# Patient Record
Sex: Female | Born: 1940 | Race: White | Hispanic: No | Smoking: Never smoker
Health system: Southern US, Community
[De-identification: ages and names within clinical notes are randomized; demographics above are authoritative.]

## PROBLEM LIST (undated history)

## (undated) DIAGNOSIS — F419 Anxiety disorder, unspecified: Secondary | ICD-10-CM

## (undated) DIAGNOSIS — G20A1 Parkinson's disease without dyskinesia, without mention of fluctuations: Secondary | ICD-10-CM

## (undated) DIAGNOSIS — E079 Disorder of thyroid, unspecified: Secondary | ICD-10-CM

## (undated) DIAGNOSIS — I4891 Unspecified atrial fibrillation: Secondary | ICD-10-CM

## (undated) DIAGNOSIS — E119 Type 2 diabetes mellitus without complications: Secondary | ICD-10-CM

## (undated) DIAGNOSIS — G20 Parkinson's disease: Secondary | ICD-10-CM

## (undated) DIAGNOSIS — I10 Essential (primary) hypertension: Secondary | ICD-10-CM

## (undated) DIAGNOSIS — D649 Anemia, unspecified: Secondary | ICD-10-CM

## (undated) DIAGNOSIS — H269 Unspecified cataract: Secondary | ICD-10-CM

## (undated) DIAGNOSIS — E785 Hyperlipidemia, unspecified: Secondary | ICD-10-CM

## (undated) HISTORY — DX: Hyperlipidemia, unspecified: E78.5

## (undated) HISTORY — PX: THYROIDECTOMY, PARTIAL: SHX18

## (undated) HISTORY — DX: Anemia, unspecified: D64.9

## (undated) HISTORY — PX: KNEE SURGERY: SHX244

## (undated) HISTORY — DX: Anxiety disorder, unspecified: F41.9

## (undated) HISTORY — DX: Parkinson's disease: G20

## (undated) HISTORY — DX: Type 2 diabetes mellitus without complications: E11.9

## (undated) HISTORY — DX: Essential (primary) hypertension: I10

## (undated) HISTORY — DX: Unspecified cataract: H26.9

## (undated) HISTORY — DX: Disorder of thyroid, unspecified: E07.9

## (undated) HISTORY — DX: Unspecified atrial fibrillation: I48.91

## (undated) HISTORY — PX: FEMUR FRACTURE SURGERY: SHX633

## (undated) HISTORY — PX: APPENDECTOMY: SHX54

## (undated) HISTORY — PX: OTHER SURGICAL HISTORY: SHX169

## (undated) HISTORY — DX: Parkinson's disease without dyskinesia, without mention of fluctuations: G20.A1

---

## 2007-12-06 ENCOUNTER — Ambulatory Visit: Payer: Self-pay | Admitting: Internal Medicine

## 2007-12-20 ENCOUNTER — Ambulatory Visit: Payer: Self-pay | Admitting: Internal Medicine

## 2013-12-06 ENCOUNTER — Other Ambulatory Visit: Payer: Self-pay | Admitting: Nurse Practitioner

## 2014-07-03 DIAGNOSIS — E1142 Type 2 diabetes mellitus with diabetic polyneuropathy: Secondary | ICD-10-CM | POA: Insufficient documentation

## 2014-08-04 DIAGNOSIS — M51369 Other intervertebral disc degeneration, lumbar region without mention of lumbar back pain or lower extremity pain: Secondary | ICD-10-CM | POA: Insufficient documentation

## 2014-08-04 DIAGNOSIS — E119 Type 2 diabetes mellitus without complications: Secondary | ICD-10-CM | POA: Insufficient documentation

## 2014-08-04 DIAGNOSIS — I10 Essential (primary) hypertension: Secondary | ICD-10-CM | POA: Insufficient documentation

## 2014-08-04 DIAGNOSIS — M169 Osteoarthritis of hip, unspecified: Secondary | ICD-10-CM | POA: Insufficient documentation

## 2014-08-04 DIAGNOSIS — M5136 Other intervertebral disc degeneration, lumbar region: Secondary | ICD-10-CM | POA: Insufficient documentation

## 2014-12-17 DIAGNOSIS — G259 Extrapyramidal and movement disorder, unspecified: Secondary | ICD-10-CM | POA: Insufficient documentation

## 2014-12-17 DIAGNOSIS — F325 Major depressive disorder, single episode, in full remission: Secondary | ICD-10-CM | POA: Insufficient documentation

## 2014-12-17 DIAGNOSIS — E039 Hypothyroidism, unspecified: Secondary | ICD-10-CM | POA: Insufficient documentation

## 2014-12-24 DIAGNOSIS — IMO0002 Reserved for concepts with insufficient information to code with codable children: Secondary | ICD-10-CM | POA: Insufficient documentation

## 2015-01-02 ENCOUNTER — Encounter: Payer: Self-pay | Admitting: Hematology & Oncology

## 2015-01-12 ENCOUNTER — Other Ambulatory Visit: Payer: Self-pay | Admitting: Family

## 2015-01-12 DIAGNOSIS — D649 Anemia, unspecified: Secondary | ICD-10-CM

## 2015-01-13 ENCOUNTER — Other Ambulatory Visit (HOSPITAL_BASED_OUTPATIENT_CLINIC_OR_DEPARTMENT_OTHER): Payer: PPO | Admitting: Lab

## 2015-01-13 ENCOUNTER — Ambulatory Visit: Payer: PPO

## 2015-01-13 ENCOUNTER — Ambulatory Visit (HOSPITAL_BASED_OUTPATIENT_CLINIC_OR_DEPARTMENT_OTHER): Payer: PPO | Admitting: Family

## 2015-01-13 ENCOUNTER — Encounter: Payer: Self-pay | Admitting: Family

## 2015-01-13 VITALS — BP 165/70 | HR 59 | Temp 98.1°F | Resp 16 | Ht 68.0 in | Wt 185.0 lb

## 2015-01-13 DIAGNOSIS — D649 Anemia, unspecified: Secondary | ICD-10-CM

## 2015-01-13 DIAGNOSIS — E119 Type 2 diabetes mellitus without complications: Secondary | ICD-10-CM

## 2015-01-13 LAB — CBC WITH DIFFERENTIAL (CANCER CENTER ONLY)
BASO#: 0 10*3/uL (ref 0.0–0.2)
BASO%: 0.5 % (ref 0.0–2.0)
EOS%: 1 % (ref 0.0–7.0)
Eosinophils Absolute: 0 10*3/uL (ref 0.0–0.5)
HCT: 35.6 % (ref 34.8–46.6)
HGB: 11.3 g/dL — ABNORMAL LOW (ref 11.6–15.9)
LYMPH#: 1.2 10*3/uL (ref 0.9–3.3)
LYMPH%: 28 % (ref 14.0–48.0)
MCH: 30.7 pg (ref 26.0–34.0)
MCHC: 31.7 g/dL — ABNORMAL LOW (ref 32.0–36.0)
MCV: 97 fL (ref 81–101)
MONO#: 0.4 10*3/uL (ref 0.1–0.9)
MONO%: 8.5 % (ref 0.0–13.0)
NEUT%: 62 % (ref 39.6–80.0)
NEUTROS ABS: 2.6 10*3/uL (ref 1.5–6.5)
PLATELETS: 158 10*3/uL (ref 145–400)
RBC: 3.68 10*6/uL — AB (ref 3.70–5.32)
RDW: 14.1 % (ref 11.1–15.7)
WBC: 4.1 10*3/uL (ref 3.9–10.0)

## 2015-01-13 LAB — CHCC SATELLITE - SMEAR

## 2015-01-13 NOTE — Progress Notes (Signed)
Hematology/Oncology Consultation   Name: Claudia Finley      MRN: [id number]    Location: Room/bed info not found  Date: 01/13/2015 Time:11:04 AM   REFERRING PHYSICIAN: Synetta Failaniel Jobe  REASON FOR CONSULT: Anemia and thrombocytopenia   DIAGNOSIS:  1. Mild anemia 2. Diabetes mellitus   HISTORY OF PRESENT ILLNESS: Claudia Finley is a very pleasant 74 yo female with a recent history of mild anemia. Her Hgb today is 11.3. She has had no episodes of bleeding.  She denies fever, chills, n/v, cough, rash, dizziness, SOB, chest pain, palpitations, abdominal pain, diarrhea, blood in urine or stool. She has been on Fusion plus vitamins and it has caused her to become constipated. She is going to stop taking this. That is fine. If she needs iron we will give her Claudia Finley.  She has some chronic swelling in her legs that comes and goes. Her legs do not appear to be swollen today. No tenderness, numbness or tingling in her extremities.  Her appetite is good and she is staying hydrated. Her weight is stable.  She has no history of bleeding or clotting disorders or cancer.  Her mother had breast cancer, an aunt with lung cancer and another aunt with brain cancer.  She has 3 children all of whom are in good health. She had 4 miscarriages. She states that this was associated with the fact that she was born with 2 uteruses.  She has had a hysterectomy.  She is retired from [employer].  ROS: All other 10 point review of systems is negative.   PAST MEDICAL HISTORY:   No past medical history on file.  ALLERGIES: Not on File    MEDICATIONS:  No current outpatient prescriptions on file prior to visit.   No current facility-administered medications on file prior to visit.     PAST SURGICAL HISTORY No past surgical history on file.  FAMILY HISTORY: No family history on file.  SOCIAL HISTORY:  has no tobacco, alcohol, and drug history on file.  PERFORMANCE STATUS: The patient's performance  status is 0 - Asymptomatic  PHYSICAL EXAM: Most Recent Vital Signs: There were no vitals taken for this visit. BP 165/70 mmHg  Pulse 59  Temp(Src) 98.1 F (36.7 C) (Oral)  Resp 16  Ht 5\' 8"  (1.727 m)  Wt 185 lb (83.915 kg)  BMI 28.14 kg/m2  General Appearance:    Alert, cooperative, no distress, appears stated age  Head:    Normocephalic, without obvious abnormality, atraumatic  Eyes:    PERRL, conjunctiva/corneas clear, EOM's intact, fundi    benign, both eyes  Ears:    Normal TM's and external ear canals, both ears  Nose:   Nares normal, septum midline, mucosa normal, no drainage    or sinus tenderness        Back:     Symmetric, no curvature, ROM normal, no CVA tenderness  Lungs:     Clear to auscultation bilaterally, respirations unlabored  Chest Wall:    No tenderness or deformity   Heart:    Regular rate and rhythm, S1 and S2 normal, no murmur, rub   or gallop  Breast Exam:    No tenderness, masses, or nipple abnormality  Abdomen:     Soft, non-tender, bowel sounds active all four quadrants,    no masses, no organomegaly        Extremities:   Extremities normal, atraumatic, no cyanosis or edema  Pulses:   2+ and symmetric all extremities  Skin:   Skin color, texture, turgor normal, no rashes or lesions  Lymph nodes:   Cervical, supraclavicular, and axillary nodes normal  Neurologic:   CNII-XII intact, normal strength, sensation and reflexes    throughout   LABORATORY DATA:  Results for orders placed or performed in visit on 01/13/15 (from the past 48 hour(s))  CBC with Differential [facility])     Status: Abnormal   Collection Time: 01/13/15 10:20 AM  Result Value Ref Range   WBC 4.1 3.9 - 10.0 10e3/uL   RBC 3.68 (L) 3.70 - 5.32 10e6/uL   HGB 11.3 (L) 11.6 - 15.9 g/dL   HCT 40.9 81.1 - 91.4 %   MCV 97 81 - 101 fL   MCH 30.7 26.0 - 34.0 pg   MCHC 31.7 (L) 32.0 - 36.0 g/dL   RDW 78.2 95.6 - 21.3 %   Platelets 158 145 - 400 10e3/uL   NEUT# 2.6 1.5 - 6.5  10e3/uL   LYMPH# 1.2 0.9 - 3.3 10e3/uL   MONO# 0.4 0.1 - 0.9 10e3/uL   Eosinophils Absolute 0.0 0.0 - 0.5 10e3/uL   BASO# 0.0 0.0 - 0.2 10e3/uL   NEUT% 62.0 39.6 - 80.0 %   LYMPH% 28.0 14.0 - 48.0 %   MONO% 8.5 0.0 - 13.0 %   EOS% 1.0 0.0 - 7.0 %   BASO% 0.5 0.0 - 2.0 %  [facility] - Smear     Status: None   Collection Time: 01/13/15 10:20 AM  Result Value Ref Range   Smear Result Smear Available       RADIOGRAPHY: No results found.     PATHOLOGY: None   ASSESSMENT/PLAN: Claudia Finley is a very pleasant 74 yo female with a recent history of mild anemia and is diabetic. She is doing well and is asymptomatic at this time.  Her Hgb today is 11.3 and her differential was normal. Her smear was reviewed by Dr. Myna Hidalgo and he saw no abnormality or evidence of malignancy.  We will have her come back in 3 months for labs and follow-up. At that time we will check her iron studies and erythropoietin level. She knows to call here with any questions or concerns and to go to the ED in the event of an emergency. We can certainly see her sooner if need be.  The patient was discussed with Dr. Myna Hidalgo and he is in agreement with the aforementioned.   [place] [facility]

## 2015-03-20 DIAGNOSIS — E785 Hyperlipidemia, unspecified: Secondary | ICD-10-CM | POA: Insufficient documentation

## 2015-03-23 DIAGNOSIS — D72819 Decreased white blood cell count, unspecified: Secondary | ICD-10-CM | POA: Insufficient documentation

## 2015-04-07 DIAGNOSIS — K635 Polyp of colon: Secondary | ICD-10-CM | POA: Insufficient documentation

## 2015-04-13 ENCOUNTER — Ambulatory Visit (HOSPITAL_BASED_OUTPATIENT_CLINIC_OR_DEPARTMENT_OTHER): Payer: PPO | Admitting: Family

## 2015-04-13 ENCOUNTER — Encounter: Payer: Self-pay | Admitting: Family

## 2015-04-13 ENCOUNTER — Other Ambulatory Visit (HOSPITAL_BASED_OUTPATIENT_CLINIC_OR_DEPARTMENT_OTHER): Payer: PPO

## 2015-04-13 VITALS — BP 117/53 | HR 91 | Temp 97.5°F | Resp 14 | Ht 68.0 in | Wt 182.0 lb

## 2015-04-13 DIAGNOSIS — D649 Anemia, unspecified: Secondary | ICD-10-CM

## 2015-04-13 DIAGNOSIS — E119 Type 2 diabetes mellitus without complications: Secondary | ICD-10-CM | POA: Diagnosis not present

## 2015-04-13 DIAGNOSIS — D696 Thrombocytopenia, unspecified: Secondary | ICD-10-CM

## 2015-04-13 DIAGNOSIS — R05 Cough: Secondary | ICD-10-CM

## 2015-04-13 LAB — CBC WITH DIFFERENTIAL (CANCER CENTER ONLY)
BASO#: 0 10e3/uL (ref 0.0–0.2)
BASO%: 0.5 % (ref 0.0–2.0)
EOS%: 0.8 % (ref 0.0–7.0)
Eosinophils Absolute: 0 10e3/uL (ref 0.0–0.5)
HCT: 35.8 % (ref 34.8–46.6)
HGB: 12 g/dL (ref 11.6–15.9)
LYMPH#: 1.7 10e3/uL (ref 0.9–3.3)
LYMPH%: 44.1 % (ref 14.0–48.0)
MCH: 31.6 pg (ref 26.0–34.0)
MCHC: 33.5 g/dL (ref 32.0–36.0)
MCV: 94 fL (ref 81–101)
MONO#: 0.5 10e3/uL (ref 0.1–0.9)
MONO%: 12.8 % (ref 0.0–13.0)
NEUT#: 1.6 10e3/uL (ref 1.5–6.5)
NEUT%: 41.8 % (ref 39.6–80.0)
Platelets: 169 10e3/uL (ref 145–400)
RBC: 3.8 10e6/uL (ref 3.70–5.32)
RDW: 13.8 % (ref 11.1–15.7)
WBC: 3.8 10e3/uL — ABNORMAL LOW (ref 3.9–10.0)

## 2015-04-13 LAB — IRON AND TIBC CHCC
%SAT: 15 % — AB (ref 21–57)
Iron: 49 ug/dL (ref 41–142)
TIBC: 325 ug/dL (ref 236–444)
UIBC: 276 ug/dL (ref 120–384)

## 2015-04-13 LAB — FERRITIN CHCC: FERRITIN: 25 ng/mL (ref 9–269)

## 2015-04-13 LAB — CHCC SATELLITE - SMEAR

## 2015-04-13 NOTE — Progress Notes (Signed)
Hematology and Oncology Follow Up Visit  Claudia Finley [id number] [birth year] 74 y.o. 04/13/2015   Principle Diagnosis:  Anemia unspecified   Current Therapy:   Observation    Interim History:  Claudia Finley is here today with her husband for a follow-up. She is just getting over a bout with bronchitis. She has a cough that is productive. She says she cough up some phlegm once a day now that it is clearing up. Her lungs sound clear.  She is mildly SOB at times with exertion.  He Hgb today is up to 12.0 MCV 94 and platelet count is 169.  She has had no bruising or episodes of bleeding. We did check iron studies and an erythropoietin level on her today. These are pending.  She denies fatigue, fever, chills, n/v, rash dizziness, chest pain, palpitations, abdominal pain, diarrhea, blood in urine or stool. No lymphadenopathy on exam. She has some constipation which is relieved with prune juice.  No swelling, tenderness, numbness or tingling in her extremities. No new aches or pains.    Medications:    Medication List       This list is accurate as of: 04/13/15 11:37 AM.  Always use your most recent med list.               atorvastatin 40 MG tablet  Commonly known as:  LIPITOR  Take 40 mg by mouth every morning.     Biotin 5000 MCG Caps  Take by mouth every morning.     diltiazem 180 MG 24 hr capsule  Commonly known as:  DILACOR XR  Take 180 mg by mouth daily.     docusate sodium 100 MG capsule  Commonly known as:  COLACE  Take 100 mg by mouth 2 (two) times daily.     escitalopram 10 MG tablet  Commonly known as:  LEXAPRO  Take 10 mg by mouth daily.     Fish Oil 1000 MG Caps  Take by mouth every morning.     FUSION PLUS PO  Take by mouth every morning.     glucose blood test strip  Use as directed for blood sugar maintenance.     l-methylfolate-B6-B12 3-35-2 MG Tabs  Commonly known as:  METANX  Take 1 tablet by mouth daily.     levothyroxine 88 MCG tablet    Commonly known as:  SYNTHROID, LEVOTHROID  Take 88 mcg by mouth daily before breakfast.     metFORMIN 500 MG 24 hr tablet  Commonly known as:  GLUCOPHAGE-XR  Take 2 mg by mouth 2 (two) times daily. 1000 mg bid     quinapril 20 MG tablet  Commonly known as:  ACCUPRIL  Take 10 mg by mouth daily. TAKES 1/2 OF 10 MG.= 5 MG     rOPINIRole 1 MG tablet  Commonly known as:  REQUIP  Two in the AM , one at lunch and two in the late afternoon.        Allergies: Not on File  Past Medical History, Surgical history, Social history, and Family History were reviewed and updated.  Review of Systems: All other 10 point review of systems is negative.   Physical Exam:  vitals were not taken for this visit.  Wt Readings from Last 3 Encounters:  01/13/15 185 lb (83.915 kg)    Ocular: Sclerae unicteric, pupils equal, round and reactive to light Ear-nose-throat: Oropharynx clear, dentition fair Lymphatic: No cervical or supraclavicular adenopathy Lungs no rales or rhonchi, good excursion  bilaterally Heart regular rate and rhythm, no murmur appreciated Abd soft, nontender, positive bowel sounds MSK no focal spinal tenderness, no joint edema Neuro: non-focal, well-oriented, appropriate affect Breasts: Deferred  Lab Results  Component Value Date   WBC 3.8* 04/13/2015   HGB 12.0 04/13/2015   HCT 35.8 04/13/2015   MCV 94 04/13/2015   PLT 169 04/13/2015   No results found for: FERRITIN, IRON, TIBC, UIBC, IRONPCTSAT Lab Results  Component Value Date   RBC 3.80 04/13/2015   No results found for: KPAFRELGTCHN, LAMBDASER, KAPLAMBRATIO No results found for: IGGSERUM, IGA, IGMSERUM No results found for: TOTALPROTELP, ALBUMINELP, A1GS, A2GS, BETS, BETA2SER, GAMS, MSPIKE, SPEI   Chemistry   No results found for: NA, K, CL, CO2, BUN, CREATININE, GLU No results found for: CALCIUM, ALKPHOS, AST, ALT, BILITOT   Impression and Plan: Claudia Finley is a very pleasant 74 yo female with a recent  history of mild anemia and who is also diabetic. She is feeling better after having bronchitis this past week.  Her anemia and thrombocytopenia have improved without us having to intervene. We will continue to follow along and monitor.    Her Hgb is up to 12.0 and platelet count is 165. We will see what her iron and erythropoietin level shows.  We will plan to see her back in 6 months for labs and follow-up.  She knows to call here with any questions or concerns and to go to the ED in the event of an emergency. We can certainly see her sooner if need be.   Verdie MosherINCINNATI,SARAH M, NP 5/16/201611:37 AM

## 2015-04-15 ENCOUNTER — Telehealth: Payer: Self-pay | Admitting: Hematology & Oncology

## 2015-04-15 ENCOUNTER — Other Ambulatory Visit: Payer: Self-pay | Admitting: Family

## 2015-04-15 DIAGNOSIS — D649 Anemia, unspecified: Secondary | ICD-10-CM

## 2015-04-15 LAB — RETICULOCYTES (CHCC)
ABS Retic: 54.5 10*3/uL (ref 19.0–186.0)
RBC.: 3.89 MIL/uL (ref 3.87–5.11)
Retic Ct Pct: 1.4 % (ref 0.4–2.3)

## 2015-04-15 LAB — ERYTHROPOIETIN: Erythropoietin: 26.9 m[IU]/mL — ABNORMAL HIGH (ref 2.6–18.5)

## 2015-04-15 NOTE — Telephone Encounter (Signed)
Per Md order to sch iron apt.  Apt was sch and patient was called and given apt date/time.  Patient is aware of apt

## 2015-04-21 ENCOUNTER — Ambulatory Visit (HOSPITAL_BASED_OUTPATIENT_CLINIC_OR_DEPARTMENT_OTHER): Payer: PPO

## 2015-04-21 VITALS — BP 148/60 | HR 63 | Temp 97.8°F | Resp 20

## 2015-04-21 DIAGNOSIS — D509 Iron deficiency anemia, unspecified: Secondary | ICD-10-CM

## 2015-04-21 DIAGNOSIS — E119 Type 2 diabetes mellitus without complications: Secondary | ICD-10-CM

## 2015-04-21 DIAGNOSIS — D649 Anemia, unspecified: Secondary | ICD-10-CM

## 2015-04-21 MED ORDER — SODIUM CHLORIDE 0.9 % IV SOLN
510.0000 mg | Freq: Once | INTRAVENOUS | Status: AC
  Administered 2015-04-21: 510 mg via INTRAVENOUS
  Filled 2015-04-21: qty 17

## 2015-04-21 MED ORDER — SODIUM CHLORIDE 0.9 % IV SOLN
Freq: Once | INTRAVENOUS | Status: AC
  Administered 2015-04-21: 12:00:00 via INTRAVENOUS

## 2015-04-21 NOTE — Patient Instructions (Signed)

## 2015-06-02 ENCOUNTER — Encounter: Payer: Self-pay | Admitting: Family

## 2015-06-02 ENCOUNTER — Other Ambulatory Visit (HOSPITAL_BASED_OUTPATIENT_CLINIC_OR_DEPARTMENT_OTHER): Payer: PPO

## 2015-06-02 ENCOUNTER — Ambulatory Visit (HOSPITAL_BASED_OUTPATIENT_CLINIC_OR_DEPARTMENT_OTHER): Payer: PPO | Admitting: Family

## 2015-06-02 VITALS — BP 120/68 | HR 83 | Temp 97.9°F | Resp 16 | Ht 68.0 in | Wt 182.0 lb

## 2015-06-02 DIAGNOSIS — R5383 Other fatigue: Secondary | ICD-10-CM | POA: Diagnosis not present

## 2015-06-02 DIAGNOSIS — D509 Iron deficiency anemia, unspecified: Secondary | ICD-10-CM

## 2015-06-02 DIAGNOSIS — E119 Type 2 diabetes mellitus without complications: Secondary | ICD-10-CM | POA: Diagnosis not present

## 2015-06-02 DIAGNOSIS — D649 Anemia, unspecified: Secondary | ICD-10-CM

## 2015-06-02 LAB — CBC WITH DIFFERENTIAL (CANCER CENTER ONLY)
BASO#: 0 10*3/uL (ref 0.0–0.2)
BASO%: 0.3 % (ref 0.0–2.0)
EOS%: 0.8 % (ref 0.0–7.0)
Eosinophils Absolute: 0 10*3/uL (ref 0.0–0.5)
HEMATOCRIT: 37.6 % (ref 34.8–46.6)
HEMOGLOBIN: 12.5 g/dL (ref 11.6–15.9)
LYMPH#: 1.5 10*3/uL (ref 0.9–3.3)
LYMPH%: 41.9 % (ref 14.0–48.0)
MCH: 33 pg (ref 26.0–34.0)
MCHC: 33.2 g/dL (ref 32.0–36.0)
MCV: 99 fL (ref 81–101)
MONO#: 0.3 10*3/uL (ref 0.1–0.9)
MONO%: 7.8 % (ref 0.0–13.0)
NEUT#: 1.8 10*3/uL (ref 1.5–6.5)
NEUT%: 49.2 % (ref 39.6–80.0)
Platelets: 138 10*3/uL — ABNORMAL LOW (ref 145–400)
RBC: 3.79 10*6/uL (ref 3.70–5.32)
RDW: 15.5 % (ref 11.1–15.7)
WBC: 3.6 10*3/uL — ABNORMAL LOW (ref 3.9–10.0)

## 2015-06-02 LAB — FERRITIN CHCC: Ferritin: 83 ng/ml (ref 9–269)

## 2015-06-02 LAB — IRON AND TIBC CHCC
%SAT: 24 % (ref 21–57)
IRON: 67 ug/dL (ref 41–142)
TIBC: 278 ug/dL (ref 236–444)
UIBC: 211 ug/dL (ref 120–384)

## 2015-06-02 NOTE — Progress Notes (Signed)
Hematology and Oncology Follow Up Visit  Claudia Finley [id number] 1941-09-18 74 y.o. 06/02/2015   Principle Diagnosis:  Anemia unspecified   Current Therapy:   IV iron as indicated - last dose 04/21/15    Interim History:  Claudia Finley is here today with her husband for a follow-up. She is feeling better but still has some fatigue at times. She was having an issues with bursitis in her left hip and received a steroid injection. This along with a medrol dose pack has helped relieve her discomfort.  She states that her blood sugars have been controlled and are staying in the mid to high 90's consistently. She has had no bruising or episodes of bleeding.  She denies fever, chills, n/v, rash dizziness, chest pain, palpitations, abdominal pain, diarrhea, blood in urine or stool. She is still having constipation. I advised her to start taking Miralax BID until she has a BM and then once daily. She is going to try this.  No swelling, tenderness, numbness or tingling in her extremities. No new aches or pains.  She has a good appetite and is eating healthy. She admits that she should be drinking more fluids. Her weight is stable.   Medications:    Medication List       This list is accurate as of: 06/02/15 12:39 PM.  Always use your most recent med list.               acetaminophen 500 MG tablet  Commonly known as:  TYLENOL  Take 500 mg by mouth every 6 (six) hours as needed.     atorvastatin 40 MG tablet  Commonly known as:  LIPITOR  Take 40 mg by mouth every morning.     Biotin 5000 MCG Caps  Take by mouth every morning.     diltiazem 180 MG 24 hr capsule  Commonly known as:  DILACOR XR  Take 180 mg by mouth daily.     diphenhydramine-acetaminophen 25-500 MG Tabs  Commonly known as:  TYLENOL PM  Take 1 tablet by mouth at bedtime as needed.     docusate sodium 100 MG capsule  Commonly known as:  COLACE  Take 100 mg by mouth daily.     Fish Oil 1000 MG Caps  Take by mouth  every morning.     gabapentin 100 MG capsule  Commonly known as:  NEURONTIN  1 tab po QHS PRN     glucose blood test strip  Use as directed for blood sugar maintenance.     levothyroxine 88 MCG tablet  Commonly known as:  SYNTHROID, LEVOTHROID  Take 88 mcg by mouth daily before breakfast.     metFORMIN 500 MG 24 hr tablet  Commonly known as:  GLUCOPHAGE-XR  Take 2 mg by mouth 2 (two) times daily. 1000 mg bid     quinapril 20 MG tablet  Commonly known as:  ACCUPRIL  Take 10 mg by mouth daily. TAKES 1/2 OF 10 MG.= 5 MG     rOPINIRole 1 MG tablet  Commonly known as:  REQUIP  Two in the AM , one at lunch and two in the late afternoon.        Allergies:  Allergies  Allergen Reactions  . Escitalopram     Hallucinations    Past Medical History, Surgical history, Social history, and Family History were reviewed and updated.  Review of Systems: All other 10 point review of systems is negative.   Physical Exam:  height is   (1.727 m) and weight is 182 lb (82.555 kg). Her oral temperature is 97.9 F (36.6 C). Her blood pressure is 120/68 and her pulse is 83. Her respiration is 16.   Wt Readings from Last 3 Encounters:  06/02/15 182 lb (82.555 kg)  04/13/15 182 lb (82.555 kg)  01/13/15 185 lb (83.915 kg)    Ocular: Sclerae unicteric, pupils equal, round and reactive to light Ear-nose-throat: Oropharynx clear, dentition fair Lymphatic: No cervical or supraclavicular adenopathy Lungs no rales or rhonchi, good excursion bilaterally Heart regular rate and rhythm, no murmur appreciated Abd soft, nontender, positive bowel sounds MSK no focal spinal tenderness, no joint edema Neuro: non-focal, well-oriented, appropriate affect Breasts: Deferred  Lab Results  Component Value Date   WBC 3.6* 06/02/2015   HGB 12.5 06/02/2015   HCT 37.6 06/02/2015   MCV 99 06/02/2015   PLT 138* 06/02/2015   Lab Results  Component Value Date   FERRITIN 25 04/13/2015   IRON 49  04/13/2015   TIBC 325 04/13/2015   UIBC 276 04/13/2015   IRONPCTSAT 15* 04/13/2015   Lab Results  Component Value Date   RETICCTPCT 1.4 04/13/2015   RBC 3.79 06/02/2015   RETICCTABS 54.5 04/13/2015   No results found for: KPAFRELGTCHN, LAMBDASER, KAPLAMBRATIO No results found for: IGGSERUM, IGA, IGMSERUM No results found for: TOTALPROTELP, ALBUMINELP, A1GS, A2GS, BETS, BETA2SER, GAMS, MSPIKE, SPEI   Chemistry   No results found for: NA, K, CL, CO2, BUN, CREATININE, GLU No results found for: CALCIUM, ALKPHOS, AST, ALT, BILITOT   Impression and Plan: Claudia Finley is a very pleasant 74 yo female with a recent history of mild anemia and diabetes. Her blood sugars are controlled and her CBC today looks good. She has had some mild fatigue.  We will see what her iron studies show and bring her in later this week for Feraheme if needed.  We will plan to see her back in 6 weeks for labs and follow-up.  She knows to call here with any questions or concerns and to go to the ED in the event of an emergency. We can certainly see her sooner if need be.   Verdie Mosher, NP 7/5/201612:39 PM

## 2015-06-22 DIAGNOSIS — G20 Parkinson's disease: Secondary | ICD-10-CM | POA: Insufficient documentation

## 2015-07-22 ENCOUNTER — Encounter: Payer: Self-pay | Admitting: Hematology & Oncology

## 2015-07-22 ENCOUNTER — Other Ambulatory Visit (HOSPITAL_BASED_OUTPATIENT_CLINIC_OR_DEPARTMENT_OTHER): Payer: PPO

## 2015-07-22 ENCOUNTER — Ambulatory Visit (HOSPITAL_BASED_OUTPATIENT_CLINIC_OR_DEPARTMENT_OTHER): Payer: PPO | Admitting: Hematology & Oncology

## 2015-07-22 VITALS — BP 141/64 | HR 77 | Temp 97.8°F | Resp 16 | Ht 68.0 in | Wt 195.0 lb

## 2015-07-22 DIAGNOSIS — G20 Parkinson's disease: Secondary | ICD-10-CM | POA: Diagnosis not present

## 2015-07-22 DIAGNOSIS — D530 Protein deficiency anemia: Secondary | ICD-10-CM

## 2015-07-22 DIAGNOSIS — D520 Dietary folate deficiency anemia: Secondary | ICD-10-CM

## 2015-07-22 DIAGNOSIS — D509 Iron deficiency anemia, unspecified: Secondary | ICD-10-CM

## 2015-07-22 LAB — CBC WITH DIFFERENTIAL (CANCER CENTER ONLY)
BASO#: 0 10*3/uL (ref 0.0–0.2)
BASO%: 0.6 % (ref 0.0–2.0)
EOS%: 0.9 % (ref 0.0–7.0)
Eosinophils Absolute: 0 10*3/uL (ref 0.0–0.5)
HCT: 34.8 % (ref 34.8–46.6)
HGB: 11.4 g/dL — ABNORMAL LOW (ref 11.6–15.9)
LYMPH#: 0.9 10*3/uL (ref 0.9–3.3)
LYMPH%: 25 % (ref 14.0–48.0)
MCH: 33 pg (ref 26.0–34.0)
MCHC: 32.8 g/dL (ref 32.0–36.0)
MCV: 101 fL (ref 81–101)
MONO#: 0.4 10*3/uL (ref 0.1–0.9)
MONO%: 10.1 % (ref 0.0–13.0)
NEUT%: 63.4 % (ref 39.6–80.0)
NEUTROS ABS: 2.2 10*3/uL (ref 1.5–6.5)
Platelets: 144 10*3/uL — ABNORMAL LOW (ref 145–400)
RBC: 3.45 10*6/uL — AB (ref 3.70–5.32)
RDW: 14.4 % (ref 11.1–15.7)
WBC: 3.5 10*3/uL — ABNORMAL LOW (ref 3.9–10.0)

## 2015-07-22 LAB — IRON AND TIBC CHCC
%SAT: 26 % (ref 21–57)
IRON: 76 ug/dL (ref 41–142)
TIBC: 291 ug/dL (ref 236–444)
UIBC: 215 ug/dL (ref 120–384)

## 2015-07-22 LAB — FERRITIN CHCC: Ferritin: 44 ng/ml (ref 9–269)

## 2015-07-22 NOTE — Progress Notes (Signed)
Hematology and Oncology Follow Up Visit  Claudia Finley [id number] [birth year] 74 y.o. 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 Last him back in July. She has gotten iron before. She received iron back in May.  On saw her in July, her iron studies showed a ferritin of 83 with iron saturation of 24%.  Her erythropoietin level is also low.  In May when we first saw her, her erythropoietin level was 27. We have not had to give her any Aranesp because her hemoglobin has not been below 11.  She and her husband just got back  from vacation[place]  She feels tired. I think a lot of it is from all of her medications. She does have some cardiac irregularities.  She's had no obvious bleeding.  She has had no change in bowel or bladder habits.  She's had some leg swelling. She has some fluid retention. She goes back to see her family doctor in a couple months  Overall, her performance status is ECOG 2.  She does have Parkinson's disease. It does make it difficult for her to get around..  Medications:  Current outpatient prescriptions:  .  acetaminophen (TYLENOL) 500 MG tablet, Take 500 mg by mouth every 6 (six) hours as needed., Disp: , Rfl:  .  atorvastatin (LIPITOR) 40 MG tablet, Take 40 mg by mouth every morning. , Disp: , Rfl:  .  Biotin 5000 MCG CAPS, Take by mouth every morning., Disp: , Rfl:  .  carbidopa-levodopa (SINEMET IR) 25-100 MG per tablet, Take by mouth., Disp: , Rfl:  .  diclofenac (FLECTOR) 1.3 % PTCH, Apply topically., Disp: , Rfl:  .  diltiazem (DILACOR XR) 180 MG 24 hr capsule, Take 180 mg by mouth daily. , Disp: , Rfl:  .  diphenhydramine-acetaminophen (TYLENOL PM) 25-500 MG TABS, Take 1 tablet by mouth at bedtime as needed., Disp: , Rfl:  .   docusate sodium (COLACE) 100 MG capsule, Take 100 mg by mouth daily., Disp: , Rfl:  .  doxycycline (VIBRA-TABS) 100 MG tablet, Take 100 mg by mouth., Disp: , Rfl:  .  gabapentin (NEURONTIN) 100 MG capsule, 1 tab po QHS PRN, Disp: , Rfl:  .  glucose blood test strip, Use as directed for blood sugar maintenance., Disp: , Rfl:  .  meloxicam (MOBIC) 15 MG tablet, Take 15 mg by mouth., Disp: , Rfl:  .  metFORMIN (GLUCOPHAGE-XR) 500 MG 24 hr tablet, Take 2 mg by mouth 2 (two) times daily. 1000 mg bid, Disp: , Rfl:  .  Omega-3 Fatty Acids (FISH OIL) 1000 MG CAPS, Take by mouth every morning., Disp: , Rfl:  .  polyethylene glycol (MIRALAX / GLYCOLAX) packet, Take by mouth., Disp: , Rfl:  .  quinapril (ACCUPRIL) 20 MG tablet, Take 10 mg by mouth daily. TAKES 1/2 OF 10 MG.= 5 MG, Disp: , Rfl:  .  rOPINIRole (REQUIP) 1 MG tablet, Two in the AM , one at lunch and two in the late afternoon., Disp: , Rfl:  .  levothyroxine (SYNTHROID, LEVOTHROID) 88 MCG tablet, Take 88 mcg by mouth daily before breakfast. , Disp: , Rfl:   Allergies:  Allergies  Allergen Reactions  . Escitalopram     Hallucinations    Past Medical History,  Surgical history, Social history, and Family History were reviewed and updated.  Review of Systems: As above  Physical Exam:  height is  (1.727 m) and weight is 195 lb (88.451 kg). Her oral temperature is 97.8 F (36.6 C). Her blood pressure is 141/64 and her pulse is 77. Her respiration is 16.   Wt Readings from Last 3 Encounters:  07/22/15 195 lb (88.451 kg)  06/02/15 182 lb (82.555 kg)  04/13/15 182 lb (82.555 kg)     Elderly white female in no obvious distress. Head and neck exam shows no ocular or oral lesions. She has no palpable cervical or supraclavicular lymph nodes. Lungs are clear bilaterally. Cardiac exam regular rate and rhythm. She has a 1/6 systolic ejection murmur. Abdomen is soft. She is slightly obese. She has good bowel sounds. There is no palpable liver  or spleen tip. Back exam shows some slight kyphosis. Extremities shows 1 hiving 2+ edema in her lower legs. She has decent range of motion of her joints. Skin exam shows some scattered ecchymoses. Neurological exam shows the Parkinson's facies. Her gait is slightly wide-based.  Lab Results  Component Value Date   WBC 3.5* 07/22/2015   HGB 11.4* 07/22/2015   HCT 34.8 07/22/2015   MCV 101 07/22/2015   PLT 144* 07/22/2015     Chemistry   No results found for: NA, K, CL, CO2, BUN, CREATININE, GLU No results found for: CALCIUM, ALKPHOS, AST, ALT, BILITOT       Impression and Plan: Claudia Finley is 74 year old white female. She has multifactorial anemia. Her anemia is mostly from low iron and probably from low erythropoietin level. She is on quite a few medications. I'm sure that anemia of chronic disease is a possibility given that she has Parkinson's.  We will have see with the iron levels show. I don't think she would qualify for Aranesp.  I want to see her back in another 6 weeks. Hopefully, if her hemoglobin is better at that time, we can move her appointments out every 3 months.   Josph Macho, MD 8/24/201610:59 AM

## 2015-10-14 ENCOUNTER — Other Ambulatory Visit (HOSPITAL_BASED_OUTPATIENT_CLINIC_OR_DEPARTMENT_OTHER): Payer: PPO

## 2015-10-14 ENCOUNTER — Encounter: Payer: Self-pay | Admitting: Family

## 2015-10-14 ENCOUNTER — Ambulatory Visit (HOSPITAL_BASED_OUTPATIENT_CLINIC_OR_DEPARTMENT_OTHER): Payer: PPO | Admitting: Family

## 2015-10-14 VITALS — BP 157/83 | HR 70 | Temp 97.3°F | Wt 196.4 lb

## 2015-10-14 DIAGNOSIS — D649 Anemia, unspecified: Secondary | ICD-10-CM

## 2015-10-14 DIAGNOSIS — G20 Parkinson's disease: Secondary | ICD-10-CM | POA: Diagnosis not present

## 2015-10-14 DIAGNOSIS — D551 Anemia due to other disorders of glutathione metabolism: Secondary | ICD-10-CM

## 2015-10-14 DIAGNOSIS — D520 Dietary folate deficiency anemia: Secondary | ICD-10-CM

## 2015-10-14 LAB — RETICULOCYTES (CHCC)
ABS Retic: 54.6 10*3/uL (ref 19.0–186.0)
RBC.: 3.9 MIL/uL (ref 3.87–5.11)
Retic Ct Pct: 1.4 % (ref 0.4–2.3)

## 2015-10-14 LAB — CBC WITH DIFFERENTIAL (CANCER CENTER ONLY)
BASO#: 0 10*3/uL (ref 0.0–0.2)
BASO%: 0.5 % (ref 0.0–2.0)
EOS ABS: 0.1 10*3/uL (ref 0.0–0.5)
EOS%: 1.2 % (ref 0.0–7.0)
HEMATOCRIT: 37.7 % (ref 34.8–46.6)
HGB: 12.2 g/dL (ref 11.6–15.9)
LYMPH#: 1.2 10*3/uL (ref 0.9–3.3)
LYMPH%: 27.1 % (ref 14.0–48.0)
MCH: 31.3 pg (ref 26.0–34.0)
MCHC: 32.4 g/dL (ref 32.0–36.0)
MCV: 97 fL (ref 81–101)
MONO#: 0.4 10*3/uL (ref 0.1–0.9)
MONO%: 9.5 % (ref 0.0–13.0)
NEUT#: 2.7 10*3/uL (ref 1.5–6.5)
NEUT%: 61.7 % (ref 39.6–80.0)
PLATELETS: 160 10*3/uL (ref 145–400)
RBC: 3.9 10*6/uL (ref 3.70–5.32)
RDW: 13.9 % (ref 11.1–15.7)
WBC: 4.3 10*3/uL (ref 3.9–10.0)

## 2015-10-14 LAB — FERRITIN CHCC: Ferritin: 38 ng/ml (ref 9–269)

## 2015-10-14 LAB — IRON AND TIBC CHCC
%SAT: 20 % — AB (ref 21–57)
IRON: 62 ug/dL (ref 41–142)
TIBC: 311 ug/dL (ref 236–444)
UIBC: 249 ug/dL (ref 120–384)

## 2015-10-14 LAB — CHCC SATELLITE - SMEAR

## 2015-10-14 NOTE — Progress Notes (Signed)
Hematology and Oncology Follow Up Visit  Claudia Finley [id number] 1940-12-05 74 y.o. 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 Claudia Finley is here today for a follow-up. She is doing well.  She had 1 dose of Feraheme in May of this year and responded quite nicely. Her Hgb at this time is 12.2 with an MCV of 97.  She has some mild fatigue at times. She has Parkinson's and states that she has not had any issues with this. She takes her Sinemet daily.  She denies fever, chills, n/v, rash, cough, dizziness, chest pain, palpitations, abdominal pain or changes in bowel or bladder habits.  No swelling, tenderness, numbness or tingling in her extremities.   She is eating well and staying hydrated. Her weight is stable.  She states that her blood sugars are still well controlled on Metformin.   Medications:    Medication List       This list is accurate as of: 10/14/15  9:19 PM.  Always use your most recent med list.               acetaminophen 500 MG tablet  Commonly known as:  TYLENOL  Take 500 mg by mouth every 6 (six) hours as needed.     atorvastatin 40 MG tablet  Commonly known as:  LIPITOR  Take 40 mg by mouth every morning.     Biotin 5000 MCG Caps  Take by mouth every morning.     carbidopa-levodopa 25-100 MG tablet  Commonly known as:  SINEMET IR  Take by mouth.     diltiazem 180 MG 24 hr capsule  Commonly known as:  DILACOR XR  Take 180 mg by mouth daily.     diphenhydramine-acetaminophen 25-500 MG Tabs tablet  Commonly known as:  TYLENOL PM  Take 1 tablet by mouth at bedtime as needed.     gabapentin 100 MG capsule  Commonly known as:  NEURONTIN  1 tab po QHS PRN     gabapentin 100 MG capsule  Commonly known as:  NEURONTIN  Take 100 mg by mouth every morning.     glucose blood test strip    Use as directed for blood sugar maintenance.     levothyroxine 88 MCG tablet  Commonly known as:  SYNTHROID, LEVOTHROID  Take 88 mcg by mouth daily before breakfast.     meloxicam 15 MG tablet  Commonly known as:  MOBIC  Take 15 mg by mouth.     metFORMIN 500 MG 24 hr tablet  Commonly known as:  GLUCOPHAGE-XR  Take 2 mg by mouth 2 (two) times daily. 1000 mg bid     polyethylene glycol packet  Commonly known as:  MIRALAX / GLYCOLAX  Take by mouth.     rOPINIRole 1 MG tablet  Commonly known as:  REQUIP  Two in the AM , one at lunch and two in the late afternoon.     telmisartan 40 MG tablet  Commonly known as:  MICARDIS  Take 40 mg by mouth.        Allergies:  Allergies  Allergen Reactions  . Escitalopram     Hallucinations    Past Medical History, Surgical history, Social history, and Family History were reviewed and updated.  Review of Systems: All other 10 point review of systems is negative.  Physical Exam:  weight is 196 lb 6.4 oz (89.086 kg). Her oral temperature is 97.3 F (36.3 C). Her blood pressure is 157/83 and her pulse is 70.   Wt Readings from Last 3 Encounters:  10/14/15 196 lb 6.4 oz (89.086 kg)  07/22/15 195 lb (88.451 kg)  06/02/15 182 lb (82.555 kg)    Ocular: Sclerae unicteric, pupils equal, round and reactive to light Ear-nose-throat: Oropharynx clear, dentition fair Lymphatic: No cervical or supraclavicular adenopathy Lungs no rales or rhonchi, good excursion bilaterally Heart regular rate and rhythm, no murmur appreciated Abd soft, nontender, positive bowel sounds MSK no focal spinal tenderness, no joint edema Neuro: non-focal, well-oriented, appropriate affect Breasts: Deferred  Lab Results  Component Value Date   WBC 4.3 10/14/2015   HGB 12.2 10/14/2015   HCT 37.7 10/14/2015   MCV 97 10/14/2015   PLT 160 10/14/2015   Lab Results  Component Value Date   FERRITIN 38 10/14/2015   IRON 62 10/14/2015   TIBC 311 10/14/2015    UIBC 249 10/14/2015   IRONPCTSAT 20* 10/14/2015   Lab Results  Component Value Date   RETICCTPCT 1.4 10/14/2015   RBC 3.90 10/14/2015   RETICCTABS 54.6 10/14/2015   No results found for: KPAFRELGTCHN, LAMBDASER, KAPLAMBRATIO No results found for: IGGSERUM, IGA, IGMSERUM No results found for: TOTALPROTELP, ALBUMINELP, A1GS, A2GS, BETS, BETA2SER, GAMS, MSPIKE, SPEI   Chemistry   No results found for: NA, K, CL, CO2, BUN, CREATININE, GLU No results found for: CALCIUM, ALKPHOS, AST, ALT, BILITOT   Impression and Plan: [name] is a very pleasant 74 yo female with a history of mild anemia and erythropoietin deficiency. She has some occasional fatigue which she attributes to her Parkinson's disease.  We will see what her iron studies show and bring her in later this week for Feraheme if needed.  We will plan to see her back in 3 months for labs and follow-up.  She will contact us with any questions or concerns. We can certainly see her sooner if need be.   Verdie Mosher, NP 11/16/20169:19 PM

## 2015-10-15 ENCOUNTER — Other Ambulatory Visit: Payer: Self-pay | Admitting: Nurse Practitioner

## 2015-10-27 ENCOUNTER — Ambulatory Visit: Payer: PPO

## 2015-10-29 ENCOUNTER — Telehealth: Payer: Self-pay | Admitting: Family

## 2015-10-29 NOTE — Telephone Encounter (Signed)
Patient's husband Claudia Finley called and stated that patient fell and broke her leg last week. The patient will not be able to come in for a Iron Infusion.       [facility]

## 2015-10-30 ENCOUNTER — Telehealth: Payer: Self-pay | Admitting: *Deleted

## 2015-10-30 NOTE — Telephone Encounter (Signed)
Vicky RN given orders for lab draw on this patient. They are to draw CBC, Ferritin, Iron TIBC early next week and fax office results.

## 2016-01-14 ENCOUNTER — Ambulatory Visit: Payer: PPO | Admitting: Family

## 2016-01-14 ENCOUNTER — Ambulatory Visit: Payer: PPO

## 2016-01-14 ENCOUNTER — Other Ambulatory Visit: Payer: PPO

## 2016-01-20 ENCOUNTER — Telehealth: Payer: Self-pay | Admitting: Hematology & Oncology

## 2016-01-20 NOTE — Telephone Encounter (Signed)
Betsey Holiday: [id number] Treating Provider: Dr. Arlan Organ Visits: 12 Status: Approved Dates: 01/13/2016 - 07/13/2016      A2130 - Iron      COPY SCANNED

## 2016-03-03 ENCOUNTER — Ambulatory Visit: Payer: PPO

## 2016-03-03 ENCOUNTER — Other Ambulatory Visit: Payer: PPO

## 2016-03-03 ENCOUNTER — Ambulatory Visit: Payer: PPO | Admitting: Family

## 2016-03-07 ENCOUNTER — Other Ambulatory Visit: Payer: Self-pay

## 2016-03-07 DIAGNOSIS — E119 Type 2 diabetes mellitus without complications: Secondary | ICD-10-CM

## 2016-03-07 DIAGNOSIS — Z7984 Long term (current) use of oral hypoglycemic drugs: Principal | ICD-10-CM

## 2016-03-07 NOTE — Patient Outreach (Signed)
[facility]N) Care Management  03/07/2016  Claudia Finley [birth year] [id number]   Telephone Screen  Referral Date: 03/03/16 Referral Source: [facility] Referral Reason: "disease and symptom management"   Outreach attempt #1 to patient. Patient reached. Screening completed.  Social: Patient resides in her home along with her spouse. She is independent with ADLs. Spouse transports her to medical appts. Patient sustained a broken femur in November 2016 secondary to fall and continues to recover from that. She is ambulatory with use of walker. She also reports that she has transport wheelchair and scooter in the home. She denies any further falls.  Conditions: Patient self reported history of DM, anemia and HTN. She reports she checks blood sugars every monring. CBG this am was 99. She is unsure of last A1C level but believes it was around 6. She reports she is due for lab work in the near future. Patient does not monitor BP in the home. However, she voices that it is always "normal" when checked at the MD office.   Medications: Patient denies any issues with affordability or with managing of meds. She reports taking between 10-15 meds. She uses a Medical laboratory scientific officermed planner which she fills herself weekly.    Appointments: She states she saw PCP(Dr. Derrell LollingJobe) a few weeks ago.   Consent: [facility] She voices no issues for care coordination at this time. However, patient is agreeable to [facility] health coach to help further manage and control her diabetes.    Plan: RN CM will notify [facility] of case status. RN CM will send [facility] referral for further diabetes education and management.  Antionette Fairyoshanda Sherrilynn Gudgel, RN,BSN,CCM [facility] Care [facility] Telephonic Care Management Coordinator Direct Phone: [phone] Toll Free: [phone] Fax: [phone]

## 2016-03-18 ENCOUNTER — Other Ambulatory Visit: Payer: Self-pay

## 2016-03-18 NOTE — Patient Outreach (Signed)
[facility](THN) Care Management  03/18/2016  Joellen Jerseyeggy T Delia 28-Jun-1941 [id number]  Telephone call to patient for initial assessment.  No answer.  HIPAA compliant voice message left.    Plan: RN Health Coach will contact patient within 1-2 weeks.  Bary Lericheionne J Nikkita Adeyemi, RN, MSN [facility] Care Management RN Telephonic Health Coach [phone]

## 2016-03-23 ENCOUNTER — Other Ambulatory Visit: Payer: Self-pay

## 2016-03-23 NOTE — Patient Outreach (Signed)
[facility]N) Care Management  [facility]  03/23/2016   Claudia Jerseyeggy T Finley [birth year] [id number]  Subjective: Telephone call to patient for initial assessment.  Patient reports she is doing ok.  She reports a femur fracture in November 2016.  She reports that her recovery has been slow but she is still trying .  Patient reports her last A1c was 6.1.  She reports it has always been in the 6 range.  Discussed A1c and goals for A1c. Also discussed with patient importance of regular exercise to help with diabetes and recovery from her femur fracture. Patient has not had an eye exam within the last year due to fracture but has upcoming appointment.  Patient voices no concerns.  Objective:   Encounter Medications:  Outpatient Encounter Prescriptions as of 03/23/2016  Medication Sig Note  . acetaminophen (TYLENOL) 500 MG tablet Take 500 mg by mouth every 6 (six) hours as needed.   . Biotin 5000 MCG CAPS Take by mouth every morning.   . carbidopa-levodopa (SINEMET IR) 25-100 MG per tablet Take by mouth. 07/22/2015: [facility]  . diltiazem (DILACOR XR) 180 MG 24 hr capsule Take 180 mg by mouth daily.  01/13/2015: [facility]  . diphenhydramine-acetaminophen (TYLENOL PM) 25-500 MG TABS Take 1 tablet by mouth at bedtime as needed.   Marland Kitchen. glucose blood test strip Use as directed for blood sugar maintenance. 01/13/2015: [facility]  . levothyroxine (SYNTHROID, LEVOTHROID) 88 MCG tablet Take 88 mcg by mouth daily before breakfast.  01/13/2015: [facility]  . metFORMIN (GLUCOPHAGE-XR) 500 MG 24 hr tablet Take 2 mg by mouth 2 (two) times daily. 1000 mg bid 01/13/2015: [facility]  . rOPINIRole (REQUIP) 1 MG tablet Two in the AM , one at lunch and two in the late afternoon. 01/13/2015: [facility]  . telmisartan (MICARDIS) 40 MG tablet Take 40 mg by mouth. 10/14/2015: [facility]  . atorvastatin (LIPITOR) 40 MG tablet Take 40 mg by mouth every morning.  01/13/2015: [facility]  . gabapentin (NEURONTIN) 100 MG capsule 1 tab po QHS PRN 06/02/2015: [facility]  . gabapentin (NEURONTIN) 100 MG capsule Take 100 mg by mouth every morning. 10/14/2015: [facility] Received Sig: 1 tab po QHS PRN  . meloxicam (MOBIC) 15 MG tablet Take 15 mg by mouth. Reported on 03/23/2016 07/22/2015: [facility]  . polyethylene glycol (MIRALAX / GLYCOLAX) packet Take by mouth. Reported on 03/23/2016 07/22/2015: [facility]   No facility-administered encounter medications on file as of 03/23/2016.    Functional Status:  In your present state of health, do you have any difficulty performing the following activities: 03/23/2016  Hearing? N  Vision? N  Difficulty concentrating or making decisions? N  Walking or climbing stairs? Y  Dressing or bathing? Y  Doing errands, shopping? Y  Preparing Food and eating ? N  Using the Toilet? N  In the past six months, have you accidently leaked urine? Y  Do you have problems with loss of bowel control? N  Managing your Medications? N  Managing your Finances? N  Housekeeping or managing your Housekeeping? Y    Fall/Depression Screening: PHQ 2/9 Scores 03/23/2016 03/07/2016  PHQ - 2 Score 0 0    Assessment: Patient will benefit from health coach outreach for disease management  and support for diabetes.   Plan:  [facility] CM Care Plan Problem One        Most Recent Value   Care Plan Problem One  Diabetes knowledge deficit   Role Documenting the Problem One  Health Coach   Care Plan for Problem One  Active   THN Long Term Goal (31-90 days)  Patient will maintain A1c less than 6.5 within 90 days.   THN Long Term Goal Start Date  03/23/16   Interventions for Problem One Long Term Goal  RN Health Coach discussed with patient A1c and A1c goals.   THN CM Short  Term Goal #1 (0-30 days)  Patient will be able to report maintaining low carbohydrate diet within 30 days.    THN CM Short Term Goal #1 Start Date  03/23/16   Interventions for Short Term Goal #1  Discussed avoidance of high carbohydrate foods such as potatoes, rice, and pastas.    THN CM Short Term Goal #2 (0-30 days)  Patient will report some type of exercise within 30 days.   THN CM Short Term Goal #2 Start Date  03/23/16   Interventions for Short Term Goal #2  RN Health Coach discussed with patient the importance of exercise.      RN Health Coach will provide ongoing education for patient on diabetes through phone calls and sending printed information to patient for further discussion.  RN Health Coach will send welcome packet with consent to patient as well as printed information on diabetes.  RN Health Coach will send initial barriers letter, assessment, and care plan to primary care physician. RN Health Coach will contact patient within one month and patient agrees to next contact.

## 2016-04-18 ENCOUNTER — Other Ambulatory Visit: Payer: Self-pay

## 2016-04-18 NOTE — Patient Outreach (Signed)
[facility]) Care Management  [facility] Care Manager  04/18/2016   Claudia Finley [birth year] [id number]  Subjective: Telephone call to patient for monthly call.  Patient reports she is doing good.  Patient reports that her blood sugars are running in the 90's with today's blood sugar was 94. Discussed with patient importance of maintaining low carbohydrate diet in order to maintain blood sugars.  She verbalized understanding.  No concerns.   Objective:   Encounter Medications:  Outpatient Encounter Prescriptions as of 04/18/2016  Medication Sig Note  . acetaminophen (TYLENOL) 500 MG tablet Take 500 mg by mouth every 6 (six) hours as needed.   . Biotin 5000 MCG CAPS Take by mouth every morning.   . carbidopa-levodopa (SINEMET IR) 25-100 MG per tablet Take by mouth. 07/22/2015: Received from: [facility]  . diltiazem (DILACOR XR) 180 MG 24 hr capsule Take 180 mg by mouth daily.  01/13/2015: Received from: [facility]  . diphenhydramine-acetaminophen (TYLENOL PM) 25-500 MG TABS Take 1 tablet by mouth at bedtime as needed.   Marland Kitchen glucose blood test strip Use as directed for blood sugar maintenance. 01/13/2015: Received from: [facility]  . levothyroxine (SYNTHROID, LEVOTHROID) 88 MCG tablet Take 88 mcg by mouth daily before breakfast.  01/13/2015: Received from: [facility]  . meloxicam (MOBIC) 15 MG tablet Take 15 mg by mouth. Reported on 03/23/2016 07/22/2015: Received from: [facility]  . metFORMIN (GLUCOPHAGE-XR) 500 MG 24 hr tablet Take 2 mg by mouth 2 (two) times daily. 1000 mg bid 01/13/2015: Received from: [facility]  . polyethylene glycol (MIRALAX / GLYCOLAX) packet Take by mouth. Reported on 03/23/2016 07/22/2015: Received from: [facility]  . rOPINIRole (REQUIP) 1 MG tablet Two in the AM , one at lunch and two in the late afternoon. 01/13/2015: Received from: [facility]  . telmisartan (MICARDIS) 40 MG tablet Take 40 mg by mouth. 10/14/2015: Received  [facility]: [facility]  . atorvastatin (LIPITOR) 40 MG tablet Take 40 mg by mouth every morning.  01/13/2015: Received from: [facility]  . gabapentin (NEURONTIN) 100 MG capsule 1 tab po QHS PRN 06/02/2015: Received from: [facility]  . gabapentin (NEURONTIN) 100 MG capsule Take 100 mg by mouth every morning. 10/14/2015: Re[facility]: [facility] Received Sig: 1 tab po QHS PRN   No facility-administered encounter medications on file as of 04/18/2016.    Functional Status:  In your present state of health, do you have any difficulty performing the following activities: 03/23/2016  Hearing? N  Vision? N  Difficulty concentrating or making decisions? N  Walking or climbing stairs? Y  Dressing or bathing? Y  Doing errands, shopping? Y  Preparing Food and eating ? N  Using the Toilet? N  In the past six months, have you accidently leaked urine? Y  Do you have problems with loss of bowel control? N  Managing your Medications? N  Managing your Finances? N  Housekeeping or managing your Housekeeping? Y    Fall/Depression Screening: PHQ 2/9 Scores 03/23/2016 03/07/2016  PHQ - 2 Score 0 0    Assessment: Patient continues to benefit from health coach outreach for disease management and support.    Plan:  [facility] CM Care Plan Problem One        Most Recent Value   Care Plan Problem One  Diabetes knowledge deficit   Role Documenting the Problem One  Health Coach   Care Plan for Problem  One  Active   THN Long Term Goal (31-90 days)  Patient will maintain A1c less than 6.5 within 90 days.   THN Long Term Goal Start Date  03/23/16 [goal continued]   Interventions for Problem One Long Term Goal  RN Health Coach reviewed with patient A1c and A1c goals.   THN CM Short Term Goal #1 (0-30 days)  Patient will be able to report maintaining low carbohydrate diet within 30 days.    THN CM Short Term Goal #1 Start Date  04/18/16 [goal continued]   Interventions for Short Term Goal #1  RN Health  Coach reviewed avoidance of high carbohydrate foods such as potatoes, rice, and pastas.    THN CM Short Term Goal #2 (0-30 days)  Patient will report some type of exercise within 30 days.   THN CM Short Term Goal #2 Start Date  04/18/16 Staci Righter[goal continued]   Interventions for Short Term Goal #2  RN Health Coach reviewed with patient the importance of exercise.      RN Health Coach will contact patient within one month and patient agrees to next outreach.    Bary Lericheionne J Raymund Manrique, RN, MSN [facility] Care Management RN Telephonic Health Coach [phone]

## 2016-05-13 ENCOUNTER — Other Ambulatory Visit: Payer: Self-pay

## 2016-05-13 NOTE — Patient Outreach (Signed)
[facility]) Care Management  05/13/2016  Joellen Jerseyeggy T Depascale [birth year] [id number]   Telephone call to patient for monthly call.  No answer.  HIPAA compliant voice message left.    Plan: RN Health Coach will attempt patient within 2 weeks.     Bary Lericheionne J Lilee Aldea, RN, MSN [facility] Care Management RN Telephonic Health Coach [phone]

## 2016-05-20 ENCOUNTER — Other Ambulatory Visit: Payer: Self-pay

## 2016-05-20 NOTE — Patient Outreach (Signed)
[facility]) Care Management  05/20/2016  Claudia Finley 07-20-1941 [id number]   Return telephone call to patient. Patient reports after reviewing information and thinking things over she has decided to opt out of the program. Advised patient on benefit of the program. Patient declined.    Plan: RN Health Coach will notify physician and care management assistant of case closure. RN Health Coach will send patient closure letter.   Bary Lericheionne J Melecio Cueto, RN, MSN [facility] Care Management RN Telephonic Health Coach [phone]

## 2016-05-24 ENCOUNTER — Ambulatory Visit: Payer: Self-pay

## 2018-01-24 ENCOUNTER — Encounter: Payer: Self-pay | Admitting: Internal Medicine

## 2018-03-13 ENCOUNTER — Encounter: Payer: Self-pay | Admitting: Neurology

## 2018-03-13 ENCOUNTER — Ambulatory Visit: Payer: PPO | Admitting: Neurology

## 2018-03-13 VITALS — BP 129/73 | HR 79 | Ht 69.0 in | Wt 207.0 lb

## 2018-03-13 DIAGNOSIS — R269 Unspecified abnormalities of gait and mobility: Secondary | ICD-10-CM | POA: Insufficient documentation

## 2018-03-13 DIAGNOSIS — G20 Parkinson's disease: Secondary | ICD-10-CM | POA: Diagnosis not present

## 2018-03-13 NOTE — Patient Instructions (Addendum)
Stop Daily ASA 81mg  daiy.  Stop Requip= Ropinirole 1mg  2 tabs three times a day   In house rehab

## 2018-03-13 NOTE — Progress Notes (Signed)
PATIENT: Claudia Finley DOB: 1941/09/25  Chief Complaint  Patient presents with  . Parkinson's Disease    She is here with her daughter, [name], to establish new care for her Parkinson's Disease.  She uses a rolling walker to help with walking. Her memory is worse. She has previously been treated by Dr. [name] in [facility].  Marland Kitchen PCP     HISTORICAL  CLARIS Finley is a 77 year old female, seen in refer by her primary care doctor [name] to follow-up for Parkinson's disease, she is accompanied by her daughter [name] at today's visit, initial evaluation was on March 13, 2018,  I reviewed her previous neurologist Dr. Rondel Baton note, most recent visit was on May 03, 2017, was under the care of Dr. [name] since February 2015.  She has past medical history of hypertension, hyperlipidemia, chronic atrial fibrillation, on chronic Xarelto treatment, denied previous history of coronary artery disease or stroke, somehow she is also on aspirin 81 mg daily, complains of frequent bruise, also recent worsening bilateral lower extremity edema.  She was seen by Dr. [name] since 2015 because of gradual onset bilateral hands tremor, diffuse weakness, gait abnormality, decreased facial expression per record, micrographia, she was diagnosed with Parkinson's disease, initially was treated with Requip 0.25 mg gradually titrating up to current 2 mg 3 times a day, Sinemet was introduced around 2017, now taking 1 tablet 4 tabs daily  She fell, suffered a right femur fracture in 2017, has relied on her walker since.  She moved in assistant living [place] since December 2018, with her husband, unfortunately, her husband passed away in 01-25-19she went through a lot of changes recently,  She now complains of diffuse body achy pain, increased right shoulder pain, had a history of right rotator cuff syndrome, she also complains of diffuse joint pain, especially right knee, slow moving, she  can still ambulate with her walker to the dining hall, along the hallway, dressing, but has worsening urinary incontinence, wearing depends, require help with bathing.   She was also noted to have slow worsening memory loss,  MRI was normal in February 2015,  Echocardiogram in March 2019 showed normal left ventricular site systolic function was no appreciable segmental abnormality, ejection fraction was 60%, moderate tricuspid regurgitation,  Laboratory evaluation March 2019 showed normal CMP creatinine of 0.65, hemoglobin of 10.2, LDL was 52  REVIEW OF SYSTEMS: Full 14 system review of systems performed and notable only for as above  ALLERGIES: Allergies  Allergen Reactions  . Benztropine     Other reaction(s): Hallucinations Stomach cramps Stomach cramps   . Escitalopram     Hallucinations    HOME MEDICATIONS: Current Outpatient Medications  Medication Sig Dispense Refill  . acetaminophen (TYLENOL) 500 MG tablet Take 500 mg by mouth every 6 (six) hours as needed.    Marland Kitchen aspirin 81 MG tablet Take 81 mg by mouth daily.    Marland Kitchen atorvastatin (LIPITOR) 40 MG tablet Take 40 mg by mouth daily.    . Biotin 5000 MCG CAPS Take by mouth every morning.    . carbidopa-levodopa (SINEMET IR) 25-100 MG per tablet Take 1 tablet by mouth 4 (four) times daily.     . Cholecalciferol (VITAMIN D3) 2000 units TABS Take 2,000 Units by mouth daily.    . Cyanocobalamin (VITAMIN B-12 IJ) Inject 1,000 Units as directed every 30 (thirty) days.    Marland Kitchen diltiazem (DILACOR XR) 180 MG 24 hr capsule Take 180 mg  by mouth daily.     . diphenhydramine-acetaminophen (TYLENOL PM) 25-500 MG TABS Take 1 tablet by mouth at bedtime as needed.    . docusate sodium (COLACE) 100 MG capsule Take 100 mg by mouth daily as needed for mild constipation.    Marland Kitchen escitalopram (LEXAPRO) 10 MG tablet Take 10 mg by mouth daily.    . furosemide (LASIX) 20 MG tablet Take 20 mg by mouth.    . furosemide (LASIX) 40 MG tablet Take 40 mg by mouth.     . Iron Combinations (IRON COMPLEX PO) Take 1 tablet by mouth daily.    Marland Kitchen levothyroxine (SYNTHROID, LEVOTHROID) 88 MCG tablet Take 88 mcg by mouth daily before breakfast.     . metFORMIN (GLUCOPHAGE-XR) 500 MG 24 hr tablet Take 2 mg by mouth 2 (two) times daily. 1000 mg bid    . omeprazole (PRILOSEC) 40 MG capsule Take 40 mg by mouth daily.    . Polyethylene Glycol 3350-GRX POWD Take by mouth as needed.    . rivaroxaban (XARELTO) 20 MG TABS tablet Take 20 mg by mouth daily with supper.    Marland Kitchen rOPINIRole (REQUIP) 1 MG tablet Take 2 mg by mouth 3 (three) times daily.      No current facility-administered medications for this visit.     PAST MEDICAL HISTORY: Past Medical History:  Diagnosis Date  . Anemia   . Atrial fibrillation (HCC)   . Cataract   . Diabetes mellitus without complication (HCC)   . Hyperlipidemia   . Hypertension   . Parkinson disease (HCC)     PAST SURGICAL HISTORY: Past Surgical History:  Procedure Laterality Date  . FEMUR FRACTURE SURGERY      FAMILY HISTORY: No family history on file.  SOCIAL HISTORY:  Social History   Socioeconomic History  . Marital status: Married    Spouse name: Not on file  . Number of children: Not on file  . Years of education: Not on file  . Highest education level: Not on file  Occupational History  . Not on file  Social Needs  . Financial resource strain: Not on file  . Food insecurity:    Worry: Not on file    Inability: Not on file  . Transportation needs:    Medical: Not on file    Non-medical: Not on file  Tobacco Use  . Smoking status: Never Smoker  . Smokeless tobacco: Never Used  . Tobacco comment: NEVER SMOKED  Substance and Sexual Activity  . Alcohol use: Not on file  . Drug use: Not on file  . Sexual activity: Never  Lifestyle  . Physical activity:    Days per week: Not on file    Minutes per session: Not on file  . Stress: Not on file  Relationships  . Social connections:    Talks on phone:  Not on file    Gets together: Not on file    Attends religious service: Not on file    Active member of club or organization: Not on file    Attends meetings of clubs or organizations: Not on file    Relationship status: Not on file  . Intimate partner violence:    Fear of current or ex partner: Not on file    Emotionally abused: Not on file    Physically abused: Not on file    Forced sexual activity: Not on file  Other Topics Concern  . Not on file  Social History Narrative  . Not  on file     PHYSICAL EXAM   Vitals:   03/13/18 1028  Weight: 207 lb (93.9 kg)  Height: 5\' 9"  (1.753 m)    Not recorded      Body mass index is 30.57 kg/m.  PHYSICAL EXAMNIATION:  Gen: NAD, conversant, well nourised, obese, well groomed                     Cardiovascular: Regular rate rhythm, no peripheral edema, warm, nontender. Eyes: Conjunctivae clear without exudates or hemorrhage Neck: Supple, no carotid bruits. Pulmonary: Clear to auscultation bilaterally   NEUROLOGICAL EXAM:   MMSE - Mini Mental State Exam 03/13/2018  Orientation to time 5  Orientation to Place 5  Registration 3  Attention/ Calculation 3  Recall 2  Language- name 2 objects 2  Language- repeat 1  Language- follow 3 step command 3  Language- read & follow direction 1  Write a sentence 1  Copy design 1  Total score 27     CRANIAL NERVES: CN II: Visual fields are full to confrontation. Fundoscopic exam is normal with sharp discs and no vascular changes. Pupils are round equal and briskly reactive to light. CN III, IV, VI: extraocular movement are normal. No ptosis. CN V: Facial sensation is intact to pinprick in all 3 divisions bilaterally. Corneal responses are intact.  CN VII: Face is symmetric with normal eye closure and smile. CN VIII: Hearing is normal to rubbing fingers CN IX, X: Palate elevates symmetrically. Phonation is normal. CN XI: Head turning and shoulder shrug are intact CN XII: Tongue is  midline with normal movements and no atrophy.  MOTOR: Mild bilateral hand posturing tremor, significant limited range of motion of right shoulder, no weakness, no significant rigidity or bradykinesia, lower extremity motor strength is normal, significant bilateral lower extremity pitting edema to mid shin level  REFLEXES: Reflexes are hypoactive and symmetric at the biceps, triceps, knees, and ankles. Plantar responses are flexor.  SENSORY: Intact to light touch, pinprick, positional sensation and vibratory sensation are intact in fingers and toes.  COORDINATION: Rapid alternating movements and fine finger movements are intact. There is no dysmetria on finger-to-nose and heel-knee-shin.    GAIT/STANCE: Slow movement, tendency to lean body backwards, moderate stride, cautious turning,   DIAGNOSTIC DATA (LABS, IMAGING, TESTING) - I reviewed patient records, labs, notes, testing and imaging myself where available.   ASSESSMENT AND PLAN  Joellen Jerseyeggy T Rhett is a 77 y.o. female   Gait abnormality  Multifactorial, possible parkinsonism, knee pain, joint pain, deconditioning, certainly play a major roles in her gait abnormality,  She has no significant parkinsonian features, worsening bilateral lower extremity swelling, will stop Requip.  Home physical therapy  Atrial fibrillation,  On chronic Xarelto treatment, stop aspirin daily   Levert FeinsteinYijun Taz Vanness, M.D. Ph.D.  [facility] [address], [address] [place], [place] [address] Ph: [phone] Fax: [phone]  CC: Malka SoJobe, Daniel B., MD

## 2018-03-19 ENCOUNTER — Telehealth: Payer: Self-pay | Admitting: Neurology

## 2018-03-19 MED ORDER — ROPINIROLE HCL 2 MG PO TABS: 2.0000 mg | ORAL_TABLET | Freq: Every day | ORAL | 1 refills | Status: DC

## 2018-03-19 NOTE — Telephone Encounter (Signed)
I called the daughter.  The patient was apparently discontinued from Requip taking 6 mg a day to nothing.  She has started having what sounds like restless legs type symptoms at nighttime.  I will restart the Requip at 2 mg at night.  I called [facility] and talk with the nurse and gave her the order, I will also fax over a prescription.  Phone number for [facility] is [phone]  Fax number is [phone].

## 2018-03-19 NOTE — Telephone Encounter (Signed)
Pt's daughter Cordelia [place]herry ([place] called state the pt is not able to sleep since stopping requip. She states the legs ache, are jumping when trying to sleep. Please call to advise about the medication.

## 2018-06-12 ENCOUNTER — Ambulatory Visit: Payer: Medicare HMO | Admitting: Adult Health

## 2018-07-11 ENCOUNTER — Ambulatory Visit: Payer: Medicare HMO | Admitting: Adult Health

## 2018-09-03 ENCOUNTER — Encounter: Payer: Self-pay | Admitting: Adult Health

## 2018-09-03 ENCOUNTER — Ambulatory Visit: Payer: Medicare HMO | Admitting: Adult Health

## 2018-09-03 ENCOUNTER — Telehealth: Payer: Self-pay | Admitting: Adult Health

## 2018-09-03 VITALS — BP 122/70 | HR 68 | Ht 69.0 in | Wt 205.0 lb

## 2018-09-03 DIAGNOSIS — G20 Parkinson's disease: Secondary | ICD-10-CM | POA: Diagnosis not present

## 2018-09-03 DIAGNOSIS — F329 Major depressive disorder, single episode, unspecified: Secondary | ICD-10-CM | POA: Diagnosis not present

## 2018-09-03 MED ORDER — ESCITALOPRAM OXALATE 20 MG PO TABS: 10.0000 mg | ORAL_TABLET | Freq: Every day | ORAL | 3 refills | Status: DC

## 2018-09-03 MED ORDER — ROPINIROLE HCL 4 MG PO TABS: 4.0000 mg | ORAL_TABLET | Freq: Three times a day (TID) | ORAL | 3 refills | Status: DC

## 2018-09-03 NOTE — Telephone Encounter (Signed)
[facility] called [id number] (LEXAPRO)  tablet for pt. They were asking for clarification on how pt is to take this medication.   NP Shanda Bumps stated 1 tab daily and for it to be 20mg  daily.  This information was relayed to RN Lanora Manis, she is asking if this can be faxed over to them along with an order for a flu shot for pt.  The fax # is [phone] it can be faxed to the attention of RN Lanora Manis

## 2018-09-03 NOTE — Telephone Encounter (Signed)
Prescription print out was provided with daughter/patient to provide back to facility after patients visit along with completed visit information sheet provided by facility and copy of AVS. Recommended increase of lexapro from 10mg  to 20mg  daily due to increased anxiety/depression. PCP to follow up on orders for flu vaccination.

## 2018-09-03 NOTE — Patient Instructions (Signed)
Your Plan:  Increase requip from 2mg  nightly to 4mg  nightly and ensure this is taken 90-120 minutes prior to bed  Increase lexapro from 10mg  to 20mg  to help decrease anxiety  Continue Xarelto for atrial fibrillation along with continuation of following up with cardiologist  Continue lipitor 40mg  daily for cholesterol management    Follow up with Dr. Terrace Arabia in 3 months or call earlier if needed       Thank you for coming to see [place] at [facility]. I hope we have been able to provide you high quality care today.  You may receive a patient satisfaction survey over the next few weeks. We would appreciate your feedback and comments so that we may continue to improve ourselves and the health of our patients.

## 2018-09-03 NOTE — Progress Notes (Signed)
PATIENT: Claudia Finley DOB: June 09, 1941  Chief Complaint  Patient presents with  . Follow-up    she is here with her daughter Claudia Finley. Legs are very restless, hands some too. Daughter says pt has had some anxiety lately.     HISTORICAL  Claudia Finley is a 77 year old female, seen in refer by her primary care doctor Claudia Finley to follow-up for Parkinson's disease, she is accompanied by her daughter Claudia Finley at today's visit, initial evaluation was on March 13, 2018, Initial visit 03/13/2018  YY: She has past medical history of hypertension, hyperlipidemia, chronic atrial fibrillation, on chronic Xarelto treatment, denied previous history of coronary artery disease or stroke, somehow she is also on aspirin 81 mg daily, complains of frequent bruise, also recent worsening bilateral lower extremity edema. She was seen by Claudia Finley since 2015 because of gradual onset bilateral hands tremor, diffuse weakness, gait abnormality, decreased facial expression per record, micrographia, she was diagnosed with Parkinson's disease, initially was treated with Requip 0.25 mg gradually titrating up to current 2 mg 3 times a day, Sinemet was introduced around 2017, now taking 1 tablet 4 tabs daily She fell, suffered a right femur fracture in 2017, has relied on her walker since.  She moved in assistant living [place] since December 2018, with her husband, unfortunately, her husband passed away in 2019/01/30she went through a lot of changes recently, She now complains of diffuse body achy pain, increased right shoulder pain, had a history of right rotator cuff syndrome, she also complains of diffuse joint pain, especially right knee, slow moving, she can still ambulate with her walker to the dining hall, along the hallway, dressing, but has worsening urinary incontinence, wearing depends, require help with bathing. She was also noted to have slow worsening memory loss, MRI was normal in February 2015,  Echocardiogram in March 2019 showed normal left ventricular site systolic function was no appreciable segmental abnormality, ejection fraction was 60%, moderate tricuspid regurgitation,  Interval history 09/03/2018: Patient returns today for follow-up visit and is accompanied by her daughter.  After prior appointment, was recommended to discontinue Requip due to increased lower extremity swelling.  Per telephone note on 03/19/2018, patient started experiencing restless leg symptoms at night and recommended to restart Requip at 2 mg nightly.  Patient denies any improvement in ROS/parkinsonian system symptoms since restarting Requip.  She states throughout the day her legs will "jump" but will be worse at night making it difficult for her to sleep.  Patient has had improvement in lower extremity edema but has also been started on Lasix along with the use of compression stockings and elevation of legs.  She does continue to take Sinemet 1 tab 4 times daily.  She currently is sitting in wheelchair but is able to ambulate at her current facility with rolling walker.  Patient does endorse increased anxiety which has been worsening since her husband's passing in 12/27/17 despite use of Lexapro 10 mg daily.  Short-term memory present but has been stable per both daughter and patient. Daughter also has noticed worsening bradykinesia and this is observed during assessment.  Patient returns today for reevaluation.    REVIEW OF SYSTEMS: Full 14 system review of systems performed and notable only for as above  ALLERGIES: Allergies  Allergen Reactions  . Benztropine     Other reaction(s): Hallucinations Stomach cramps Stomach cramps   . Escitalopram     Hallucinations    HOME MEDICATIONS: Current Outpatient Medications  Medication Sig Dispense Refill  . acetaminophen (TYLENOL) 500 MG tablet Take 500 mg by mouth every 6 (six) hours as needed.    Marland Kitchen atorvastatin (LIPITOR) 40 MG tablet Take 40 mg by mouth daily.     Marland Kitchen BIOTIN PO Take 1,000 mcg by mouth daily.    . carbidopa-levodopa (SINEMET IR) 25-100 MG per tablet Take 1 tablet by mouth 4 (four) times daily.     . Cholecalciferol (VITAMIN D3) 2000 units TABS Take 2,000 Units by mouth daily.    . Cyanocobalamin (VITAMIN Finley-12 IJ) Inject 1,000 Units as directed every 30 (thirty) days.    Marland Kitchen diltiazem (CARDIZEM LA) 180 MG 24 hr tablet Take 180 mg by mouth daily.    . diphenhydramine-acetaminophen (TYLENOL PM) 25-500 MG TABS Take 1 tablet by mouth at bedtime as needed.    . docusate sodium (COLACE) 100 MG capsule Take 100 mg by mouth daily as needed for mild constipation.    Marland Kitchen escitalopram (LEXAPRO) 20 MG tablet Take 0.5 tablets (10 mg total) by mouth daily. 90 tablet 3  . furosemide (LASIX) 20 MG tablet Take 1 tablet by mouth every 24 hours as needed for edema. Take 20 mg for 3 days if 3 lb weight gain and take 20 mg for 7 days for 5 lb weight gain.    Marland Kitchen levothyroxine (SYNTHROID, LEVOTHROID) 88 MCG tablet Take 88 mcg by mouth daily before breakfast.     . magnesium oxide (MAG-OX) 400 MG tablet Take 400 mg by mouth daily.    . metFORMIN (GLUCOPHAGE-XR) 500 MG 24 hr tablet Take 500 mg by mouth every evening.     Marland Kitchen omeprazole (PRILOSEC) 40 MG capsule Take 40 mg by mouth daily.    . Polyethylene Glycol 3350-GRX POWD Take by mouth as needed.    Marland Kitchen POLYSACCHARIDE IRON COMPLEX PO 150-0.025-1mg  - one capsule twice daily    . rivaroxaban (XARELTO) 20 MG TABS tablet Take 20 mg by mouth daily with supper.    . telmisartan (MICARDIS) 40 MG tablet Take 40 mg by mouth daily.    Marland Kitchen rOPINIRole (REQUIP) 4 MG tablet Take 1 tablet (4 mg total) by mouth 3 (three) times daily. 90 tablet 3   No current facility-administered medications for this visit.     PAST MEDICAL HISTORY: Past Medical History:  Diagnosis Date  . Anemia   . Anxiety   . Atrial fibrillation (HCC)   . Cataract   . Diabetes mellitus without complication (HCC)   . Hyperlipidemia   . Hypertension   .  Parkinson disease (HCC)   . Thyroid disorder     PAST SURGICAL HISTORY: Past Surgical History:  Procedure Laterality Date  . APPENDECTOMY    . CESAREAN SECTION    . FEMUR FRACTURE SURGERY Right   . KNEE SURGERY Bilateral   . THYROIDECTOMY, PARTIAL    . vein sugrery      FAMILY HISTORY: Family History  Problem Relation Age of Onset  . Breast cancer Mother   . Heart disease Father     SOCIAL HISTORY:  Social History   Socioeconomic History  . Marital status: Widowed    Spouse name: Not on file  . Number of children: 4  . Years of education: 67  . Highest education level: High school graduate  Occupational History  . Occupation: Retired  Engineer, production  . Financial resource strain: Not on file  . Food insecurity:    Worry: Not on file    Inability: Not on  file  . Transportation needs:    Medical: Not on file    Non-medical: Not on file  Tobacco Use  . Smoking status: Never Smoker  . Smokeless tobacco: Never Used  . Tobacco comment: NEVER SMOKED  Substance and Sexual Activity  . Alcohol use: Never    Alcohol/week: 0.0 standard drinks    Frequency: Never  . Drug use: Never  . Sexual activity: Never  Lifestyle  . Physical activity:    Days per week: Not on file    Minutes per session: Not on file  . Stress: Not on file  Relationships  . Social connections:    Talks on phone: Not on file    Gets together: Not on file    Attends religious service: Not on file    Active member of club or organization: Not on file    Attends meetings of clubs or organizations: Not on file    Relationship status: Not on file  . Intimate partner violence:    Fear of current or ex partner: Not on file    Emotionally abused: Not on file    Physically abused: Not on file    Forced sexual activity: Not on file  Other Topics Concern  . Not on file  Social History Narrative   Right-handed.   Lives at [facility]    Decaf coffee & drinks some  diet pepsi     PHYSICAL EXAM   Vitals:   09/03/18 0923  BP: 122/70  Pulse: 68  Weight: 205 lb (93 kg)  Height: 5\' 9"  (1.753 m)    Not recorded      Body mass index is 30.27 kg/m.  PHYSICAL EXAMNIATION:  Gen: NAD, conversant, well nourised, obese, well groomed                     Cardiovascular: Regular rate rhythm, warm, nontender.  Trace pitting edema noted bilateral lower extremities Eyes: Conjunctivae clear without exudates or hemorrhage Neck: Supple, no carotid bruits. Pulmonary: Clear to auscultation bilaterally   NEUROLOGICAL EXAM:  CRANIAL NERVES: CN II: Visual fields are full to confrontation. Fundoscopic exam is normal with sharp discs and no vascular changes. Pupils are round equal and briskly reactive to light. CN III, IV, VI: extraocular movement are normal. No ptosis. CN V: Facial sensation is intact to pinprick in all 3 divisions bilaterally. Corneal responses are intact.  CN VII: Face is symmetric with normal eye closure and smile. CN VIII: Hearing is normal to rubbing fingers CN IX, X: Palate elevates symmetrically. Phonation is normal. CN XI: Head turning and shoulder shrug are intact CN XII: Tongue is midline with normal movements and no atrophy.  MOTOR: Mild bilateral hand posturing tremor, unable to adequately assess for cogwheel rigidity as she was unable to allow PROM. significant limited range of motion of right shoulder, no weakness, no significant rigidity.  Bradykinesia present; hypomimia present with flat affect; lower extremity motor strength is normal.    REFLEXES: Reflexes are hypoactive and symmetric at the biceps, triceps, knees, and ankles. Plantar responses are flexor.  SENSORY: Intact to light touch, pinprick, positional sensation and vibratory sensation are intact in fingers and toes.  COORDINATION: Rapid alternating movements and fine finger movements are diminished. There is no dysmetria on finger-to-nose and heel-knee-shin but  is slowed.    GAIT/STANCE: Patient currently sitting in wheelchair and as she did not bring RW to appointment, gait deferred   DIAGNOSTIC DATA (LABS,  IMAGING, TESTING) - I reviewed patient records, labs, notes, testing and imaging myself where available.   ASSESSMENT AND PLAN  DORINA RIBAUDO is a 77 y.o. female who was seen in this office for parkinsonism versus Parkinson's disease (patient states she does have diagnosis of Parkinson's disease) along with RLS.   As patient continues to have RLS symptoms despite restarting Requip 2 mg nightly, will increase to Requip XL 4 mg nightly.  Due to increased complaints of anxiety/depression, increase Lexapro from 10 mg to 20 mg daily.  Due to increased concerns for bradykinesia, worsening depression/anxiety and hypomimia, unsure if this is related to worsening of parkinsonism versus Parkinson's disease or multifactorial.  Recommend to follow-up with Dr. Terrace Arabia in 3 months time but advised daughter to call earlier with questions, concerns or need of sooner follow-up appointment.    George Hugh, AGNP-BC  [facility] [address] [place], [place] [address]  Phone [phone] Fax [phone] Note: This document was prepared with digital dictation and possible smart phrase technology. Any transcriptional errors that result from this process are unintentional.

## 2018-09-03 NOTE — Telephone Encounter (Signed)
:  LEft vm for [employer] that we can clarify orders for the lexapro. Rn stated that our office dont manage or write orders for flu vaccination, that will be for the pts PCP.

## 2018-09-04 MED ORDER — ESCITALOPRAM OXALATE 20 MG PO TABS: 20.0000 mg | ORAL_TABLET | Freq: Every day | ORAL | 3 refills | Status: DC

## 2018-09-04 NOTE — Telephone Encounter (Signed)
RN call Ricard Dillon about needing lexapro order revised.RN stated the revised rx of lexapro was fax to [phone]. RN also stated that we dont order flu shots per Shanda Bumps NP. Lanora Manis stated they can give the flu shot. Rn stated Shanda Bumps NP deferred it back to pts PCP. Lanora Manis verbalized understanding that PCP will have to order and manage the flu shot. Lanora Manis also verbalized understanding the lexapro rx was fax and receive via confirmation.

## 2018-09-04 NOTE — Addendum Note (Signed)
Addended by: George Hugh on: 09/04/2018 08:18 AM   Modules accepted: Orders

## 2018-09-05 NOTE — Progress Notes (Signed)
I have reviewed and agreed above plan. 

## 2018-09-06 ENCOUNTER — Telehealth: Payer: Self-pay

## 2018-09-06 MED ORDER — ROPINIROLE HCL 4 MG PO TABS: 4.0000 mg | ORAL_TABLET | Freq: Every day | ORAL | 3 refills | Status: DC

## 2018-09-06 NOTE — Addendum Note (Signed)
Addended by: George Hugh on: 09/06/2018 01:46 PM   Modules accepted: Orders

## 2018-09-06 NOTE — Telephone Encounter (Signed)
Requip rx fax to [facility]. Rx was confirmed and receive.

## 2018-10-31 ENCOUNTER — Emergency Department (HOSPITAL_COMMUNITY)
Admission: EM | Admit: 2018-10-31 | Discharge: 2018-10-31 | Disposition: A | Discharge status: Home / Self Care | Payer: Medicare HMO | Attending: Emergency Medicine | Admitting: Emergency Medicine

## 2018-10-31 ENCOUNTER — Emergency Department (HOSPITAL_COMMUNITY): Payer: Medicare HMO

## 2018-10-31 ENCOUNTER — Encounter (HOSPITAL_COMMUNITY): Payer: Self-pay | Admitting: Emergency Medicine

## 2018-10-31 ENCOUNTER — Other Ambulatory Visit: Payer: Self-pay

## 2018-10-31 DIAGNOSIS — I10 Essential (primary) hypertension: Secondary | ICD-10-CM | POA: Diagnosis not present

## 2018-10-31 DIAGNOSIS — Y929 Unspecified place or not applicable: Secondary | ICD-10-CM | POA: Insufficient documentation

## 2018-10-31 DIAGNOSIS — W19XXXA Unspecified fall, initial encounter: Secondary | ICD-10-CM

## 2018-10-31 DIAGNOSIS — S0990XA Unspecified injury of head, initial encounter: Secondary | ICD-10-CM | POA: Diagnosis present

## 2018-10-31 DIAGNOSIS — S0003XA Contusion of scalp, initial encounter: Secondary | ICD-10-CM | POA: Diagnosis not present

## 2018-10-31 DIAGNOSIS — Y998 Other external cause status: Secondary | ICD-10-CM | POA: Diagnosis not present

## 2018-10-31 DIAGNOSIS — W010XXA Fall on same level from slipping, tripping and stumbling without subsequent striking against object, initial encounter: Secondary | ICD-10-CM | POA: Diagnosis not present

## 2018-10-31 DIAGNOSIS — E119 Type 2 diabetes mellitus without complications: Secondary | ICD-10-CM | POA: Insufficient documentation

## 2018-10-31 DIAGNOSIS — I4891 Unspecified atrial fibrillation: Secondary | ICD-10-CM | POA: Diagnosis not present

## 2018-10-31 DIAGNOSIS — E785 Hyperlipidemia, unspecified: Secondary | ICD-10-CM | POA: Insufficient documentation

## 2018-10-31 DIAGNOSIS — Y9389 Activity, other specified: Secondary | ICD-10-CM | POA: Insufficient documentation

## 2018-10-31 DIAGNOSIS — S50312A Abrasion of left elbow, initial encounter: Secondary | ICD-10-CM | POA: Diagnosis not present

## 2018-10-31 DIAGNOSIS — Z79899 Other long term (current) drug therapy: Secondary | ICD-10-CM | POA: Insufficient documentation

## 2018-10-31 DIAGNOSIS — Z7984 Long term (current) use of oral hypoglycemic drugs: Secondary | ICD-10-CM | POA: Insufficient documentation

## 2018-10-31 LAB — BASIC METABOLIC PANEL
Anion gap: 9 (ref 5–15)
BUN: 12 mg/dL (ref 8–23)
CO2: 27 mmol/L (ref 22–32)
Calcium: 9.3 mg/dL (ref 8.9–10.3)
Chloride: 101 mmol/L (ref 98–111)
Creatinine, Ser: 0.83 mg/dL (ref 0.44–1.00)
GFR calc Af Amer: >60 mL/min (ref >60)
GFR calc non Af Amer: >60 mL/min (ref >60)
GLUCOSE: 124 mg/dL — AB (ref 70–99)
Potassium: 4.2 mmol/L (ref 3.5–5.1)
Sodium: 137 mmol/L (ref 135–145)

## 2018-10-31 LAB — CBC
HCT: 38.5 % (ref 36.0–46.0)
Hemoglobin: 12.3 g/dL (ref 12.0–15.0)
MCH: 31.4 pg (ref 26.0–34.0)
MCHC: 31.9 g/dL (ref 30.0–36.0)
MCV: 98.2 fL (ref 80.0–100.0)
Platelets: 118 10*3/uL — ABNORMAL LOW (ref 150–400)
RBC: 3.92 MIL/uL (ref 3.87–5.11)
RDW: 13.4 % (ref 11.5–15.5)
WBC: 4.6 10*3/uL (ref 4.0–10.5)
nRBC: 0 % (ref 0.0–0.2)

## 2018-10-31 LAB — URINALYSIS, ROUTINE W REFLEX MICROSCOPIC
Bilirubin Urine: NEGATIVE
Glucose, UA: NEGATIVE mg/dL
Ketones, ur: NEGATIVE mg/dL
Leukocytes, UA: NEGATIVE
Nitrite: NEGATIVE
Protein, ur: NEGATIVE mg/dL
Specific Gravity, Urine: 1.004 — ABNORMAL LOW (ref 1.005–1.030)
pH: 8 (ref 5.0–8.0)

## 2018-10-31 LAB — CBG MONITORING, ED: GLUCOSE-CAPILLARY: 95 mg/dL (ref 70–99)

## 2018-10-31 NOTE — Discharge Instructions (Addendum)
Return if any problems.   Change dressing and clean wound daily

## 2018-10-31 NOTE — ED Notes (Signed)
REPORT CALLED BACK TO [facility]  THE PT IS COMING BACK THERE

## 2018-10-31 NOTE — ED Notes (Signed)
CBG collected. Result "95." RN, [place], notified.

## 2018-10-31 NOTE — ED Triage Notes (Signed)
Arrived via EMS patient from assisted living [facility]. Patient hanging clothes fell backwards hitting head no trauma present and left arm skin tear. Possible LOC. Currently alert answering and following commands appropriate. Patient currently taking Xarelto.

## 2018-10-31 NOTE — ED Provider Notes (Addendum)
[facility] EMERGENCY DEPARTMENT Provider Note   CSN: [id number] Arrival date & time: 10/31/18  1256     History   Chief Complaint Chief Complaint  Patient presents with  . Fall  . Loss of Consciousness    HPI Claudia Finley is a 77 y.o. female.  The history is provided by the patient. No language interpreter was used.  Fall  This is a new problem. The current episode started 3 to 5 hours ago. The problem occurs constantly. The problem has not changed since onset.Pertinent negatives include no chest pain and no abdominal pain. Nothing aggravates the symptoms. Nothing relieves the symptoms. She has tried nothing for the symptoms. The treatment provided no relief.  Loss of Consciousness   Pertinent negatives include abdominal pain and chest pain.  Pt complains of falling backwards and hitting her head.  Pt reports she was hanging up her coat and hit her head. Pt reports she does not know how she fell.  Pt does not think she blacked out.   Past Medical History:  Diagnosis Date  . Anemia   . Anxiety   . Atrial fibrillation (HCC)   . Cataract   . Diabetes mellitus without complication (HCC)   . Hyperlipidemia   . Hypertension   . Parkinson disease (HCC)   . Thyroid disorder     Patient Active Problem List   Diagnosis Date Noted  . Gait abnormality 03/13/2018  . Parkinsonism (HCC) 03/13/2018  . Parkinson's disease (HCC) 06/22/2015  . Colon polyp 04/07/2015  . Decreased leukocytes 03/23/2015  . HLD (hyperlipidemia) 03/20/2015  . Anemia 01/12/2015  . Change in blood platelet count 12/24/2014  . Adult hypothyroidism 12/17/2014  . Depression, major, in remission (HCC) 12/17/2014  . Movement disorder 12/17/2014  . Benign essential HTN 08/04/2014  . DDD (degenerative disc disease), lumbar 08/04/2014  . Degenerative arthritis of hip 08/04/2014  . Diabetes mellitus, type 2 (HCC) 08/04/2014  . Diabetes mellitus with polyneuropathy (HCC) 07/03/2014     Past Surgical History:  Procedure Laterality Date  . APPENDECTOMY    . CESAREAN SECTION    . FEMUR FRACTURE SURGERY Right   . KNEE SURGERY Bilateral   . THYROIDECTOMY, PARTIAL    . vein sugrery       OB History   None      Home Medications    Prior to Admission medications   Medication Sig Start Date End Date Taking? Authorizing Provider  acetaminophen (TYLENOL) 500 MG tablet Take 500 mg by mouth every 6 (six) hours as needed.    [provider]  atorvastatin (LIPITOR) 40 MG tablet Take 40 mg by mouth daily.    [provider]  BIOTIN PO Take 1,000 mcg by mouth daily.    [provider]  carbidopa-levodopa (SINEMET IR) 25-100 MG per tablet Take 1 tablet by mouth 4 (four) times daily.  06/03/15   [provider]  Cholecalciferol (VITAMIN D3) 2000 units TABS Take 2,000 Units by mouth daily.    [provider]  Cyanocobalamin (VITAMIN B-12 IJ) Inject 1,000 Units as directed every 30 (thirty) days.    [provider]  diltiazem (CARDIZEM LA) 180 MG 24 hr tablet Take 180 mg by mouth daily.    [provider]  diphenhydramine-acetaminophen (TYLENOL PM) 25-500 MG TABS Take 1 tablet by mouth at bedtime as needed.    [provider]  docusate sodium (COLACE) 100 MG capsule Take 100 mg by mouth daily as needed for  mild constipation.    [provider]  escitalopram (LEXAPRO) 20 MG tablet Take 1 tablet (20 mg total) by mouth daily. 09/04/18   George Hugh, NP  furosemide (LASIX) 20 MG tablet Take 1 tablet by mouth every 24 hours as needed for edema. Take 20 mg for 3 days if 3 lb weight gain and take 20 mg for 7 days for 5 lb weight gain.    [provider]  levothyroxine (SYNTHROID, LEVOTHROID) 88 MCG tablet Take 88 mcg by mouth daily before breakfast.     [provider]  magnesium oxide (MAG-OX) 400 MG tablet Take 400 mg by mouth daily.    [provider]  metFORMIN  (GLUCOPHAGE-XR) 500 MG 24 hr tablet Take 500 mg by mouth every evening.  04/18/08   [provider]  omeprazole (PRILOSEC) 40 MG capsule Take 40 mg by mouth daily.    [provider]  Polyethylene Glycol 3350-GRX POWD Take by mouth as needed.    [provider]  POLYSACCHARIDE IRON COMPLEX PO 150-0.025-1mg  - one capsule twice daily    [provider]  rivaroxaban (XARELTO) 20 MG TABS tablet Take 20 mg by mouth daily with supper.    [provider]  rOPINIRole (REQUIP) 4 MG tablet Take 1 tablet (4 mg total) by mouth at bedtime. 09/06/18   George Hugh, NP  telmisartan (MICARDIS) 40 MG tablet Take 40 mg by mouth daily.    [provider]    Family History Family History  Problem Relation Age of Onset  . Breast cancer Mother   . Heart disease Father     Social History Social History   Tobacco Use  . Smoking status: Never Smoker  . Smokeless tobacco: Never Used  . Tobacco comment: NEVER SMOKED  Substance Use Topics  . Alcohol use: Never    Alcohol/week: 0.0 standard drinks    Frequency: Never  . Drug use: Never     Allergies   Benztropine and Escitalopram   Review of Systems Review of Systems  Cardiovascular: Positive for syncope. Negative for chest pain.  Gastrointestinal: Negative for abdominal pain.  All other systems reviewed and are negative.    Physical Exam Updated Vital Signs BP 133/81   Pulse (!) 54   Temp 98.4 F (36.9 C) (Oral)   Resp 15   Ht 5\' 9"  (1.753 m)   Wt 93 kg   SpO2 100%   BMI 30.27 kg/m   Physical Exam  Constitutional: She is oriented to person, place, and time. She appears well-developed and well-nourished.  HENT:  Head: Normocephalic.  Eyes: Pupils are equal, round, and reactive to light. Conjunctivae and EOM are normal.  Neck: Normal range of motion.  Cardiovascular: Normal rate and regular rhythm.  Pulmonary/Chest: Effort normal.  Abdominal: Soft. She exhibits no  distension.  Musculoskeletal: Normal range of motion.  Neurological: She is alert and oriented to person, place, and time.  Skin: Skin is warm.  superficial skin tear left elbow    Psychiatric: She has a normal mood and affect.  Nursing note and vitals reviewed.    ED Treatments / Results  Labs (all labs ordered are listed, but only abnormal results are displayed) Labs Reviewed  BASIC METABOLIC PANEL - Abnormal; Notable for the following components:      Result Value   Glucose, Bld 124 (*)    All other components within normal limits  CBC - Abnormal; Notable for the following components:   Platelets 118 (*)  All other components within normal limits  URINALYSIS, ROUTINE W REFLEX MICROSCOPIC - Abnormal; Notable for the following components:   Color, Urine COLORLESS (*)    Specific Gravity, Urine 1.004 (*)    Hgb urine dipstick SMALL (*)    Bacteria, UA RARE (*)    All other components within normal limits  CBG MONITORING, ED    EKG None  Radiology Dg Elbow Complete Left  Result Date: 10/31/2018 CLINICAL DATA:  Elbow pain after fall while hanging close. EXAM: LEFT ELBOW - COMPLETE 3+ VIEW COMPARISON:  None. FINDINGS: There is no evidence of fracture, dislocation, or joint effusion. There is no evidence of arthropathy or other focal bone abnormality. Soft tissue swelling over the dorsum of the proximal forearm. Degenerative spurring off the olecranon. IMPRESSION: Mild osteoarthritic change of the elbow without acute osseous abnormality. Soft tissue swelling over the dorsum of the proximal forearm. Electronically Signed   By: Tollie Ethavid  Kwon M.D.   On: 10/31/2018 14:33   Ct Head Wo Contrast  Result Date: 10/31/2018 CLINICAL DATA:  Status post falling backwards with trauma to the head. EXAM: CT HEAD WITHOUT CONTRAST CT CERVICAL SPINE WITHOUT CONTRAST TECHNIQUE: Multidetector CT imaging of the head and cervical spine was performed following the standard protocol without intravenous  contrast. Multiplanar CT image reconstructions of the cervical spine were also generated. COMPARISON:  Brain MRI 01/09/2014 FINDINGS: CT HEAD FINDINGS Brain: No evidence of acute infarction, hemorrhage, hydrocephalus, extra-axial collection or mass lesion/mass effect. Vascular: Calcific atherosclerotic disease of the intra cavernous carotid arteries. Skull: Normal. Negative for fracture or focal lesion. Sinuses/Orbits: No acute finding. Other: None. CT CERVICAL SPINE FINDINGS Alignment: 1-2 mm posterior listhesis of C5 on C6, 1 mm anterolisthesis of C4 on C5, likely degenerative. Overall straightening of the cervical lordosis. Skull base and vertebrae: No acute fracture. No primary bone lesion or focal pathologic process. Soft tissues and spinal canal: No prevertebral fluid or swelling. No visible canal hematoma. Disc levels: Multilevel osteoarthritic changes. Moderate posterior facet arthropathy. Upper chest: Negative. Other: None. IMPRESSION: No acute intracranial abnormality. No evidence of acute traumatic injury to the cervical spine. Multilevel osteoarthritic changes of the cervical spine with mild anterolisthesis of C4 on C5 and posterior listhesis of C5 on C6. Electronically Signed   By: Ted Mcalpineobrinka  Dimitrova M.D.   On: 10/31/2018 14:19   Ct Cervical Spine Wo Contrast  Result Date: 10/31/2018 CLINICAL DATA:  Status post falling backwards with trauma to the head. EXAM: CT HEAD WITHOUT CONTRAST CT CERVICAL SPINE WITHOUT CONTRAST TECHNIQUE: Multidetector CT imaging of the head and cervical spine was performed following the standard protocol without intravenous contrast. Multiplanar CT image reconstructions of the cervical spine were also generated. COMPARISON:  Brain MRI 01/09/2014 FINDINGS: CT HEAD FINDINGS Brain: No evidence of acute infarction, hemorrhage, hydrocephalus, extra-axial collection or mass lesion/mass effect. Vascular: Calcific atherosclerotic disease of the intra cavernous carotid arteries.  Skull: Normal. Negative for fracture or focal lesion. Sinuses/Orbits: No acute finding. Other: None. CT CERVICAL SPINE FINDINGS Alignment: 1-2 mm posterior listhesis of C5 on C6, 1 mm anterolisthesis of C4 on C5, likely degenerative. Overall straightening of the cervical lordosis. Skull base and vertebrae: No acute fracture. No primary bone lesion or focal pathologic process. Soft tissues and spinal canal: No prevertebral fluid or swelling. No visible canal hematoma. Disc levels: Multilevel osteoarthritic changes. Moderate posterior facet arthropathy. Upper chest: Negative. Other: None. IMPRESSION: No acute intracranial abnormality. No evidence of acute traumatic injury to the cervical spine. Multilevel osteoarthritic changes of  the cervical spine with mild anterolisthesis of C4 on C5 and posterior listhesis of C5 on C6. Electronically Signed   By: Ted Mcalpine M.D.   On: 10/31/2018 14:19   Dg Shoulder Left  Result Date: 10/31/2018 CLINICAL DATA:  Patient fell while hanging close in closet today. Pain. EXAM: LEFT SHOULDER - 2+ VIEW COMPARISON:  None. FINDINGS: Mild osteoarthritic change of the AC and glenohumeral joints characterized by slight joint space narrowing minimal degenerative spurring. No acute fracture joint dislocation. Minimal soft tissue calcifications are suggested about the humeral head suspicious for calcific rotator cuff tendinosis. The included ribs and lung are non acute. IMPRESSION: Mild osteoarthritic change of the AC and glenohumeral joints. Faint soft tissue calcifications about the humeral head may reflect calcific rotator cuff tendinopathy. Electronically Signed   By: Tollie Eth M.D.   On: 10/31/2018 14:31    Procedures Procedures (including critical care time)  Medications Ordered in ED Medications - No data to display   Initial Impression / Assessment and Plan / ED Course  I have reviewed the triage vital signs and the nursing notes.  Pertinent labs & imaging  results that were available during my care of the patient were reviewed by me and considered in my medical decision making (see chart for details).     MDM  Bandage to abrasion/skin tear,  Ct no acute abnormality to head and neck ct,  xrays no fracture.  EKg shows afib,  Labs reviewed.   Pt able to ambulate to stand without difficulty.  Pt does not have walker to walk.    Final Clinical Impressions(s) / ED Diagnoses   Final diagnoses:  Fall, initial encounter  Contusion of scalp, initial encounter  Abrasion of left elbow, initial encounter    ED Discharge Orders    None    An After Visit Summary was printed and given to the patient.    Osie Cheeks 10/31/18 1540    Azalia Bilis, MD 10/31/18 1553    Elson Areas, PA-C 10/31/18 1624    Azalia Bilis, MD 11/02/18 [id number]

## 2018-12-17 ENCOUNTER — Encounter: Payer: Self-pay | Admitting: Neurology

## 2018-12-17 ENCOUNTER — Ambulatory Visit: Payer: Medicare HMO | Admitting: Neurology

## 2018-12-17 DIAGNOSIS — N390 Urinary tract infection, site not specified: Secondary | ICD-10-CM

## 2018-12-17 DIAGNOSIS — G2581 Restless legs syndrome: Secondary | ICD-10-CM | POA: Insufficient documentation

## 2018-12-17 DIAGNOSIS — R410 Disorientation, unspecified: Secondary | ICD-10-CM

## 2018-12-17 DIAGNOSIS — G20 Parkinson's disease: Secondary | ICD-10-CM

## 2018-12-17 MED ORDER — GABAPENTIN 300 MG PO CAPS: 300.0000 mg | ORAL_CAPSULE | Freq: Two times a day (BID) | ORAL | 11 refills | Status: DC

## 2018-12-17 NOTE — Patient Instructions (Addendum)
Stop Requip 4mg  every night By taking requip 4mg  1/2 at night for one week, then stop    Start Gabapentin 300mg   Twice a day, 2pm and at bedtime.

## 2018-12-17 NOTE — Progress Notes (Signed)
PATIENT: Claudia Finley DOB: 1941-04-12  Chief Complaint  Patient presents with  . Parkinsonism/RLS    She is here with her daughters, Claudia Finley and Claudia Finley.  She has noticed more RLS symptoms during the day and early evening.  Reports she sleeps well at night.   . Anxiety/Memory Loss    MMSE 28/30 - 7 animals.  Her daughters have noticed worsening memory and increased anxiety.     HISTORICAL  Claudia Finley is a 78 year old female, seen in refer by her primary care doctor Derrell Lolling, [name] to follow-up for Parkinson's disease, she is accompanied by her daughter Claudia Finley at today's visit, initial evaluation was on March 13, 2018.  Initial visit 03/13/2018  YY: She has past medical history of hypertension, hyperlipidemia, chronic atrial fibrillation, on chronic Xarelto treatment, denied previous history of coronary artery disease or stroke, somehow she is also on aspirin 81 mg daily, complains of frequent bruise, also recent worsening bilateral lower extremity edema. She was seen by Dr. [name] since 2015 because of gradual onset bilateral hands tremor, diffuse weakness, gait abnormality, decreased facial expression per record, micrographia, she was diagnosed with Parkinson's disease, initially was treated with Requip 0.25 mg gradually titrating up to current 2 mg 3 times a day, Sinemet was introduced around 2017, now taking 1 tablet 4 tabs daily She fell, suffered a right femur fracture in 2017, has relied on her walker since.  She moved to assistant living [facility] since December 2018, with her husband, unfortunately, her husband passed away in 2019-01-19she went through a lot of changes recently, She now complains of diffuse body achy pain, increased right shoulder pain, had a history of right rotator cuff syndrome, she also complains of diffuse joint pain, especially right knee, slow moving, she can still ambulate with her walker to the dining hall, along the hallway, dressing, but has  worsening urinary incontinence, wearing depends, require help with bathing. She was also noted to have slow worsening memory loss, MRI was normal in February 2015, Echocardiogram in March 2019 showed normal left ventricular site systolic function was no appreciable segmental abnormality, ejection fraction was 60%, moderate tricuspid regurgitation.  UPDATE Dec 17 2018: Requip was stopped in April 2019 for increased bilateral lower extremity swelling, she reported worsening restless leg symptoms, failed to improve restarting Requip at 4 mg at nighttime, throughout the day her legs were jumpy, worsened at nighttime, difficulty sleeping, also complains of increased anxiety,  She is with her daughter Claudia Finley and Claudia Finley at today's visit, sleeps well at nighttime, Mini-Mental status 28/30.  She has words finding difficulty, mild memory loss, continue complains of evening and nighttime restless leg symptoms, leg jumping, difficulty holding still   REVIEW OF SYSTEMS: Full 14 system review of systems performed and notable only for eye discharge, itching, choking, leg swelling, constipation, joint pain, back pain, achy muscles, bruise easily, memory loss, weakness, agitation confusion decreased concentration depression anxiety All rest review of the system were negative  ALLERGIES: Allergies  Allergen Reactions  . Benztropine     Other reaction(s): Hallucinations Stomach cramps Stomach cramps   . Escitalopram     Hallucinations    HOME MEDICATIONS: Current Outpatient Medications  Medication Sig Dispense Refill  . acetaminophen (TYLENOL) 500 MG tablet Take 500 mg by mouth every 6 (six) hours as needed.    Marland Kitchen atorvastatin (LIPITOR) 40 MG tablet Take 40 mg by mouth daily.    Marland Kitchen BIOTIN PO Take 1,000 mcg by mouth daily.    Marland Kitchen  carbidopa-levodopa (SINEMET IR) 25-100 MG per tablet Take 1 tablet by mouth 4 (four) times daily.     . Cholecalciferol (VITAMIN D3) 2000 units TABS Take 2,000 Units by mouth daily.     . Cyanocobalamin (VITAMIN B-12 IJ) Inject 1,000 Units as directed every 30 (thirty) days.    Marland Kitchen diltiazem (CARDIZEM LA) 180 MG 24 hr tablet Take 180 mg by mouth daily.    Marland Kitchen docusate sodium (COLACE) 100 MG capsule Take 100 mg by mouth daily as needed for mild constipation.    Marland Kitchen escitalopram (LEXAPRO) 20 MG tablet Take 1 tablet (20 mg total) by mouth daily. 90 tablet 3  . furosemide (LASIX) 20 MG tablet Take 1 tablet by mouth every 24 hours as needed for edema. Take 20 mg for 3 days if 3 lb weight gain and take 20 mg for 7 days for 5 lb weight gain.    Marland Kitchen levothyroxine (SYNTHROID, LEVOTHROID) 88 MCG tablet Take 88 mcg by mouth daily before breakfast.     . magnesium oxide (MAG-OX) 400 MG tablet Take 400 mg by mouth daily.    . Melatonin 3 MG TABS Take 3 mg by mouth at bedtime.    . metFORMIN (GLUCOPHAGE-XR) 500 MG 24 hr tablet Take 500 mg by mouth every evening.     Marland Kitchen omeprazole (PRILOSEC) 40 MG capsule Take 40 mg by mouth daily.    . Polyethylene Glycol 3350-GRX POWD Take by mouth as needed.    Marland Kitchen POLYSACCHARIDE IRON COMPLEX PO 150-0.025-1mg  - one capsule twice daily    . rivaroxaban (XARELTO) 20 MG TABS tablet Take 20 mg by mouth daily with supper.    Marland Kitchen rOPINIRole (REQUIP) 4 MG tablet Take 1 tablet (4 mg total) by mouth at bedtime. 90 tablet 3  . telmisartan (MICARDIS) 40 MG tablet Take 40 mg by mouth daily.     No current facility-administered medications for this visit.     PAST MEDICAL HISTORY: Past Medical History:  Diagnosis Date  . Anemia   . Anxiety   . Atrial fibrillation (HCC)   . Cataract   . Diabetes mellitus without complication (HCC)   . Hyperlipidemia   . Hypertension   . Parkinson disease (HCC)   . Thyroid disorder     PAST SURGICAL HISTORY: Past Surgical History:  Procedure Laterality Date  . APPENDECTOMY    . CESAREAN SECTION    . FEMUR FRACTURE SURGERY Right   . KNEE SURGERY Bilateral   . THYROIDECTOMY, PARTIAL    . vein sugrery      FAMILY  HISTORY: Family History  Problem Relation Age of Onset  . Breast cancer Mother   . Heart disease Father     SOCIAL HISTORY:  Social History   Socioeconomic History  . Marital status: Widowed    Spouse name: Not on file  . Number of children: 4  . Years of education: 67  . Highest education level: High school graduate  Occupational History  . Occupation: Retired  Engineer, production  . Financial resource strain: Not on file  . Food insecurity:    Worry: Not on file    Inability: Not on file  . Transportation needs:    Medical: Not on file    Non-medical: Not on file  Tobacco Use  . Smoking status: Never Smoker  . Smokeless tobacco: Never Used  . Tobacco comment: NEVER SMOKED  Substance and Sexual Activity  . Alcohol use: Never    Alcohol/week: 0.0 standard drinks  Frequency: Never  . Drug use: Never  . Sexual activity: Never  Lifestyle  . Physical activity:    Days per week: Not on file    Minutes per session: Not on file  . Stress: Not on file  Relationships  . Social connections:    Talks on phone: Not on file    Gets together: Not on file    Attends religious service: Not on file    Active member of club or organization: Not on file    Attends meetings of clubs or organizations: Not on file    Relationship status: Not on file  . Intimate partner violence:    Fear of current or ex partner: Not on file    Emotionally abused: Not on file    Physically abused: Not on file    Forced sexual activity: Not on file  Other Topics Concern  . Not on file  Social History Narrative   Right-handed.   [facility]    Decaf coffee & drinks some diet pepsi     PHYSICAL EXAM   Vitals:    Not recorded      There is no height or weight on file to calculate BMI.  PHYSICAL EXAMNIATION:  Gen: NAD, conversant, well nourised, obese, well groomed                     Cardiovascular: Regular rate rhythm, warm, nontender.  Trace  pitting edema noted bilateral lower extremities Eyes: Conjunctivae clear without exudates or hemorrhage Neck: Supple, no carotid bruits. Pulmonary: Clear to auscultation bilaterally   MMSE - Mini Mental State Exam 12/17/2018 03/13/2018  Orientation to time 5 5  Orientation to Place 5 5  Registration 3 3  Attention/ Calculation 4 3  Recall 3 2  Language- name 2 objects 2 2  Language- repeat 1 1  Language- follow 3 step command 3 3  Language- read & follow direction 1 1  Write a sentence 1 1  Copy design 0 1  Total score 28 27  animal naming 7.  NEUROLOGICAL EXAM:  CRANIAL NERVES: CN II: Visual fields are full to confrontation. Pupils are round equal and briskly reactive to light. CN III, IV, VI: extraocular movement are normal. No ptosis. CN V: Facial sensation is intact to pinprick in all 3 divisions bilaterally. Corneal responses are intact.  CN VII: Face is symmetric with normal eye closure and smile. CN VIII: Hearing is normal to rubbing fingers CN IX, X: Palate elevates symmetrically. Phonation is normal. CN XI: Head turning and shoulder shrug are intact CN XII: Tongue is midline with normal movements and no atrophy.  MOTOR: Mild bilateral hand posturing tremor, able to move upper and extremity against gravity, limited range of motion of right proximal arm due to right shoulder pain, mild rigidity bradykinesia,  REFLEXES: Reflexes are hypoactive and symmetric at the biceps, triceps, knees, and ankles. Plantar responses are flexor.  COORDINATION: Rapid alternating movements and fine finger movements are diminished. There is no dysmetria on finger-to-nose and heel-knee-shin but is slowed.    GAIT/STANCE: She needs 2 people assistance to get up from seated position, difficulty initiate gait  DIAGNOSTIC DATA (LABS, IMAGING, TESTING) - I reviewed patient records, labs, notes, testing and imaging myself where available.   ASSESSMENT AND PLAN  Claudia Finley is a 78 y.o.  female   Parkinsonism Mild cognitive impairment  Mini-Mental Status Examination 28/30 Restless leg symptoms  Continue Sinemet  25/100 mg 4 times a day  Gabapentin 300 mg at evening and nighttime to replace Requip 4 mg at nighttime   Face to face time was 25 minutes, greater than 50% of the time was spent in counseling and coordination of care with the patient.    Levert FeinsteinYijun Shamonique Battiste, M.D. Ph.D.  [facility] [address] [place], [place] [address] Phone: [phone] Fax:      [phone]

## 2019-02-27 ENCOUNTER — Telehealth: Payer: Self-pay | Admitting: Neurology

## 2019-02-27 NOTE — Telephone Encounter (Signed)
I spoke to her daughter, Camelia Eng.  States her mother is currently taking gabapentin 300mg , one capsule BID.  Her RLS is better on gabapentin.  However, she is sleeping all day and her memory is worse since being on the medication.  She is even having difficulty completing sentences.  The patient is unable to do a telephone or virtual visit because she is a resident at [facility] and they are closed to visitors (including family).  Camelia Eng would like a call back to discuss other treatment options until she can be seen in the office again.  She can be reached at [phone].

## 2019-02-27 NOTE — Telephone Encounter (Signed)
Pt's daughter Camelia Eng called stating that the medicine change is not helping the pt. The gabapentin (NEURONTIN) 300 MG capsule is helping the wrestles leg syndrome but not helping at all the memory. Please advise.

## 2019-03-01 ENCOUNTER — Telehealth (INDEPENDENT_AMBULATORY_CARE_PROVIDER_SITE_OTHER): Payer: Medicare HMO | Admitting: Neurology

## 2019-03-01 ENCOUNTER — Encounter: Payer: Self-pay | Admitting: Neurology

## 2019-03-01 DIAGNOSIS — G2581 Restless legs syndrome: Secondary | ICD-10-CM

## 2019-03-01 DIAGNOSIS — R410 Disorientation, unspecified: Secondary | ICD-10-CM | POA: Insufficient documentation

## 2019-03-01 DIAGNOSIS — G20 Parkinson's disease: Secondary | ICD-10-CM

## 2019-03-01 NOTE — Progress Notes (Signed)
PATIENT: Claudia Finley DOB: 1941-03-22  No chief complaint on file.    HISTORICAL  Claudia Finley is a 78 year old female, seen in refer by her primary care doctor Claudia Finley, Claudia Finley to follow-up for Parkinson's disease, she is accompanied by her daughter Claudia Finley at today's visit, initial evaluation was on March 13, 2018.  Initial visit 03/13/2018  YY: She has past medical history of hypertension, hyperlipidemia, chronic atrial fibrillation, on chronic Xarelto treatment, denied previous history of coronary artery disease or stroke, somehow she is also on aspirin 81 mg daily, complains of frequent bruise, also recent worsening bilateral lower extremity edema. She was seen by Dr. [name] since 2015 because of gradual onset bilateral hands tremor, diffuse weakness, gait abnormality, decreased facial expression per record, micrographia, she was diagnosed with Parkinson's disease, initially was treated with Requip 0.25 mg gradually titrating up to current 2 mg 3 times a day, Sinemet was introduced around 2017, now taking 1 tablet 4 tabs daily She fell, suffered a right femur fracture in 2017, has relied on her walker since.  She moved to assistant living [place] since December 2018, with her husband, unfortunately, her husband passed away in 01-28-19she went through a lot of changes recently, She now complains of diffuse body achy pain, increased right shoulder pain, had a history of right rotator cuff syndrome, she also complains of diffuse joint pain, especially right knee, slow moving, she can still ambulate with her walker to the dining hall, along the hallway, dressing, but has worsening urinary incontinence, wearing depends, require help with bathing. She was also noted to have slow worsening memory loss, MRI was normal in February 2015, Echocardiogram in March 2019 showed normal left ventricular site systolic function was no appreciable segmental abnormality, ejection fraction was 60%,  moderate tricuspid regurgitation.  UPDATE Dec 17 2018: Requip was stopped in April 2019 for increased bilateral lower extremity swelling, she reported worsening restless leg symptoms, failed to improve restarting Requip at 4 mg at nighttime, throughout the day her legs were jumpy, worsened at nighttime, difficulty sleeping, also complains of increased anxiety,  She is with her daughter Claudia Finley and Claudia Finley at today's visit, sleeps well at nighttime, Mini-Mental status 28/30.  She has words finding difficulty, mild memory loss, continue complains of evening and nighttime restless leg symptoms, leg jumping, difficulty holding still  Virtual Visit via Telephone  I connected with Claudia Finley on 03/01/19 at  by telephone and verified that I am speaking with the correct person using two identifiers.   I discussed the limitations, risks, security and privacy concerns of performing an evaluation and management service by telephone and the availability of in person appointments. I also discussed with the patient that there may be a patient responsible charge related to this service. The patient expressed understanding and agreed to proceed.   History of Present Illness: Her daughter called our office, reporting worsening symptoms of confusion, visual hallucinations on Claudia Finley, she currently resides in [facility], there was no visitors allowed, her daughter can only call her, will see her through windows,  I was able to talk with her daughter Claudia Finley She was noted to have increased confusion, she is currently taking gabapentin 300 mg twice a day, has tapered off Requip, her restless leg symptoms is under better control, however she developed worsening visual hallucinations, complains that the staff feeding her sugar all day long, in the shape like a break,  I was also able to talk  with patient briefly over the phone, patient complains she feels confused,increased difficulty with word finding,  I  also talked with Claudia Finley staff, will try to decrease her gabapentin to 300 mg every night, check urine analysis to rule out infection, may consider MRI of brain if patient has continued decline,    Observations/Objective: I have reviewed problem lists, medications, allergies.  Patient has no dysarthria, no aphasia, complains of increased confusion,  Assessment and Plan: Parkinsonism Mild cognitive impairment  Mini-Mental Status Examination 28/30 in January 2020 Restless leg symptoms Worsening confusion, and visual hallucinations  Continue Sinemet 25/100 mg 4 times a day  Will decrease gabapentin from 300 mg twice daily to once every night  Urine analysis to rule out infection   Follow Up Instructions:  3 months    I discussed the assessment and treatment plan with the patient. The patient was provided an opportunity to ask questions and all were answered. The patient agreed with the plan and demonstrated an understanding of the instructions.   The patient was advised to call back or seek an in-person evaluation if the symptoms worsen or if the condition fails to improve as anticipated.  I provided 21 minutes of non-face-to-face time during this encounter.   Levert Feinstein, MD

## 2019-03-01 NOTE — Telephone Encounter (Addendum)
I was able to talk with her daughter Lavonna Rua, patient was in [facility], no visitor is allowed, she was noted to have increased confusions, difficulty picking up the phone, has visual hallucinations, complains of "they feed me sugar all day long, has brick shape"  I was also able to take with patient, she complains of increased confusion, slow thinking     Will decrease Gabapentin from 300mg  bid to once a day. Will also check UA  I called [facility] staff at  Phone number for [facility] [phone] Fax No, [phone].    Total time spend 21 minutes  Order for decrease gabapentin dose and UA need to be faxed to [facility]

## 2019-03-01 NOTE — Addendum Note (Signed)
Addended by: Levert Feinstein on: 03/01/2019 10:06 AM   Modules accepted: Orders

## 2019-03-04 NOTE — Telephone Encounter (Signed)
Orders faxed and confirmed to [facility].

## 2019-03-05 ENCOUNTER — Other Ambulatory Visit: Payer: Self-pay | Admitting: *Deleted

## 2019-03-05 ENCOUNTER — Telehealth: Payer: Self-pay | Admitting: Neurology

## 2019-03-05 DIAGNOSIS — N390 Urinary tract infection, site not specified: Secondary | ICD-10-CM

## 2019-03-05 NOTE — Telephone Encounter (Signed)
Called Patient and left her a message asking her to call me back Dr. Terrace Arabia want's her to Follow up with Maralyn Sago still on 03/18/2019 and I need to screen her for Virtual Visit.

## 2019-03-05 NOTE — Addendum Note (Signed)
Addended by: Tamera Stands D on: 03/05/2019 03:46 PM   Modules accepted: Orders

## 2019-03-05 NOTE — Telephone Encounter (Signed)
Pts daughter Camelia Eng on [place] states that the pts gabapentin (NEURONTIN) 300 MG capsule was to be decreased and daughter states that the pt informed her that the facility [facility] has not done so. Please advise.

## 2019-03-05 NOTE — Telephone Encounter (Signed)
Dr. Terrace Finley reduced the patient's gabapentin 300mg  down to one capsule per day.  I called Claudia Finley and spoke to Claudia Finley (who is responsible for the patient's care).  She informed me that Dr. Zannie Finley orders were received and her gabapentin dosage was reduced to one capsule daily on 03/04/2019.  Dr. Terrace Finley also wanted the patient to have a UA.  Per Claudia Finley, they can collect the patient's urine sample but one of her family members will have to pick it up to take it to a [facility].  The patient's PCP is outside of the facility so it would work the same way for his office.   I called her daughter, Claudia Finley) to inform her of this dilemma.  She lives in [place] so she is going to speak to her sister who lives in [place].  She is also going to contact the patient's PCP to see if he will order the UA so it can be processed in his office (rather than a freestanding [facility] location).  She is going to request the PCP to treat the infection, if present.  If the PCP is not in agreement with this plan, she will call our office back and an order can be placed for a UA in Epic.  Our lab tech, Claudia Finley, will need to release the order so the sample can be processed at another site (closer to the patient's nursing home).  Claudia Finley is also aware that the gabapentin dosage has been decreased.

## 2019-03-05 NOTE — Telephone Encounter (Signed)
Pt's daughter, Camelia Eng, called me back and stated they will need the UA ordered by Dr. Terrace Arabia.  The order has been placed in Epic and released by our lab tech, Mick Sell, so they are able to take the sample to a freestanding [facility]. The patient's other daughter will pick the urine sample up on 03/06/2019.

## 2019-03-11 NOTE — Telephone Encounter (Signed)
[place], Patient  Has not called back yet called her x2 . Thanks Annabelle Harman.  Maralyn Sago / Terrace Arabia Patient .

## 2019-03-12 NOTE — Telephone Encounter (Signed)
I called [facility] to inquire on this patient's UA sample.   They informed me it was never received.  I called the patient's daughter back (Terri at [phone]) who tells me her sister never went to pick up the collected urine sample to take to the lab.  Says her sister is working from home this week and should have time.  States her mother is better since the gabapentin was reduced but she is still showing some confusion.  I informed Camelia Eng that a UTI, especially in the elderly, can cause confusion.  Additionally, we spoke about the negative health risk that can be caused by an untreated UTI.  States she will call her sister today and ask her to go pick up the urine sample to take to [facility].

## 2019-03-13 ENCOUNTER — Other Ambulatory Visit: Payer: Self-pay | Admitting: Neurology

## 2019-03-13 ENCOUNTER — Telehealth: Payer: Self-pay | Admitting: Neurology

## 2019-03-13 NOTE — Telephone Encounter (Signed)
Called and spoke to patient and and her daughter and she is on lock down at [facility]. Patient's daughter will call back and reschedule patient. Thanks Annabelle Harman.

## 2019-03-18 ENCOUNTER — Ambulatory Visit: Payer: Medicare HMO | Admitting: Neurology

## 2019-03-18 LAB — SPECIMEN STATUS REPORT

## 2019-03-19 ENCOUNTER — Other Ambulatory Visit: Payer: Self-pay | Admitting: *Deleted

## 2019-03-19 ENCOUNTER — Telehealth: Payer: Self-pay | Admitting: *Deleted

## 2019-03-19 DIAGNOSIS — R410 Disorientation, unspecified: Secondary | ICD-10-CM

## 2019-03-19 DIAGNOSIS — N390 Urinary tract infection, site not specified: Secondary | ICD-10-CM

## 2019-03-19 NOTE — Telephone Encounter (Signed)
This patient is in quarantine in a nursing home and her daughter had to pick up the UA to take to [facility] (see previous phone note).  I spoke to Raven today at [facility] [phone]).  The patient's urine sample was received on 03/14/2019 and should have been resulted by now.  In further investigation, she informed me that [facility] did not have the test code they needed to process the sample. However, the order is in Epic and is mark collected by them.  She is unsure as to why the lab did not reach out to our office prior to the sample going bad.  A new sample with need to be collected.  I spoke to [place], med tech at [employer] [phone]), who stated she would collect the urine sample today and bring it to our office herself.  The order was originally placed by Dr. Terrace Arabia on 03/05/2019 for her daughter to pick up the sample.  Her daughter reported a work conflict and could not go to her mother's facility until the week after it was ordered (that is the reason [facility] did not receive the original sample until 03/14/2019).  Then [facility] failed to test the sample in time.    I called and spoke to the patient's daughter on DPR (Terri [phone]) to update her on this situation.

## 2019-03-19 NOTE — Addendum Note (Signed)
Addended by: Tamera Stands D on: 03/19/2019 04:44 PM   Modules accepted: Orders

## 2019-03-20 LAB — URINALYSIS, ROUTINE W REFLEX MICROSCOPIC
Bilirubin, UA: NEGATIVE
Glucose, UA: NEGATIVE
Ketones, UA: NEGATIVE
Leukocytes,UA: NEGATIVE
Nitrite, UA: NEGATIVE
Protein,UA: NEGATIVE
RBC, UA: NEGATIVE
Specific Gravity, UA: 1.008 (ref 1.005–1.030)
Urobilinogen, Ur: 0.2 mg/dL (ref 0.2–1.0)
pH, UA: 7 (ref 5.0–7.5)

## 2019-03-20 NOTE — Telephone Encounter (Signed)
Result Notes for Urinalysis with Reflex Microscopic   Notes recorded by Lilla Shook, RN on 03/20/2019 at 10:30 AM EDT I spoke to her daughter, Camelia Eng at [phone] (on [place]). She is aware of the results. ------  Notes recorded by Levert Feinstein, MD on 03/20/2019 at 10:25 AM EDT Please call patient for normal UA, no evidence of active infection

## 2019-08-21 ENCOUNTER — Ambulatory Visit: Payer: Medicare HMO | Admitting: Neurology

## 2019-08-21 ENCOUNTER — Other Ambulatory Visit: Payer: Self-pay

## 2019-08-21 ENCOUNTER — Encounter: Payer: Self-pay | Admitting: Neurology

## 2019-08-21 VITALS — BP 117/68 | HR 78 | Temp 98.4°F | Ht 69.0 in | Wt 199.5 lb

## 2019-08-21 DIAGNOSIS — G2581 Restless legs syndrome: Secondary | ICD-10-CM | POA: Diagnosis not present

## 2019-08-21 DIAGNOSIS — G20 Parkinson's disease: Secondary | ICD-10-CM | POA: Diagnosis not present

## 2019-08-21 DIAGNOSIS — R269 Unspecified abnormalities of gait and mobility: Secondary | ICD-10-CM | POA: Diagnosis not present

## 2019-08-21 MED ORDER — CARBIDOPA-LEVODOPA 25-100 MG PO TABS: 1.5000 | ORAL_TABLET | Freq: Four times a day (QID) | ORAL | 4 refills | Status: AC

## 2019-08-21 MED ORDER — CITALOPRAM HYDROBROMIDE 10 MG PO TABS: 10.0000 mg | ORAL_TABLET | Freq: Every day | ORAL | 4 refills | Status: DC

## 2019-08-21 MED ORDER — GABAPENTIN 300 MG PO CAPS: 300.0000 mg | ORAL_CAPSULE | Freq: Every day | ORAL | 4 refills | Status: AC

## 2019-08-21 NOTE — Progress Notes (Signed)
PATIENT: Claudia Finley DOB: 1941-01-30  Chief Complaint  Patient presents with  . Parkinson's Disease    MMSE 25/30 - 6 animals.  She is here with her daughter, Claudia Finley, who feels her mother's memory and confusion are much worse.  She resides in [facility].   Feels the COVID-19 restrictions have played a role in this change.      HISTORICAL  Claudia Finley is a 78 year old female, seen in refer by her primary care doctor Derrell Lolling, [name] to follow-up for Parkinson's disease, she is accompanied by her daughter Claudia Finley at today's visit, initial evaluation was on March 13, 2018.  Initial visit 03/13/2018  YY: She has past medical history of hypertension, hyperlipidemia, chronic atrial fibrillation, on chronic Xarelto treatment, denied previous history of coronary artery disease or stroke, somehow she is also on aspirin 81 mg daily, complains of frequent bruise, also recent worsening bilateral lower extremity edema. She was seen by Dr. [name] since 2015 because of gradual onset bilateral hands tremor, diffuse weakness, gait abnormality, decreased facial expression per record, micrographia, she was diagnosed with Parkinson's disease, initially was treated with Requip 0.25 mg gradually titrating up to current 2 mg 3 times a day, Sinemet was introduced around 2017, now taking 1 tablet 4 tabs daily She fell, suffered a right femur fracture in 2017, has relied on her walker since.  She moved to assistant living [place] since December 2018, with her husband, unfortunately, her husband passed away in 01/25/19she went through a lot of changes recently, She now complains of diffuse body achy pain, increased right shoulder pain, had a history of right rotator cuff syndrome, she also complains of diffuse joint pain, especially right knee, slow moving, she can still ambulate with her walker to the dining hall, along the hallway, dressing, but has worsening urinary incontinence,  wearing depends, require help with bathing. She was also noted to have slow worsening memory loss, MRI was normal in February 2015, Echocardiogram in March 2019 showed normal left ventricular site systolic function was no appreciable segmental abnormality, ejection fraction was 60%, moderate tricuspid regurgitation.  UPDATE Dec 17 2018: Requip was stopped in April 2019 for increased bilateral lower extremity swelling, she reported worsening restless leg symptoms, failed to improve restarting Requip at 4 mg at nighttime, throughout the day her legs were jumpy, worsened at nighttime, difficulty sleeping, also complains of increased anxiety,  She is with her daughter Claudia Finley and Claudia Finley at today's visit, sleeps well at nighttime, Mini-Mental status 28/30.  She has words finding difficulty, mild memory loss, continue complains of evening and nighttime restless leg symptoms, leg jumping, difficulty holding still  UPDATE Sept 23 2020: She is accompanied by her daughter Claudia Finley at today's clinical visit, last visit was through telephone in April 2020, she lives in [facility], since [facility] in March 2020, she was not able to see her family regularly, noted increased confusion, anxiety, she is taking Sinemet 25/100 mg 4 times a day, which is helpful, gabapentin 300 mg twice daily because increased daytime sleepiness, she is doing better since decreased to 300 mg every night,   REVIEW OF SYSTEMS: Full 14 system review of systems performed and notable only for as above ALLERGIES: Allergies  Allergen Reactions  . Benztropine     Other reaction(s): Hallucinations Stomach cramps Stomach cramps   . Escitalopram     Hallucinations    HOME MEDICATIONS: Current Outpatient Medications  Medication Sig Dispense Refill  .  acetaminophen (TYLENOL) 500 MG tablet Take 500 mg by mouth every 6 (six) hours as needed.    Marland Kitchen atorvastatin (LIPITOR) 40 MG tablet Take 40 mg by mouth daily.    Marland Kitchen BIOTIN PO Take 1,000  mcg by mouth daily.    . carbidopa-levodopa (SINEMET IR) 25-100 MG per tablet Take 1 tablet by mouth 4 (four) times daily.     . Cholecalciferol (VITAMIN D3) 2000 units TABS Take 2,000 Units by mouth daily.    . Cyanocobalamin (VITAMIN B-12 IJ) Inject 1,000 Units as directed every 30 (thirty) days.    Marland Kitchen diltiazem (CARDIZEM LA) 180 MG 24 hr tablet Take 180 mg by mouth daily.    Marland Kitchen docusate sodium (COLACE) 100 MG capsule Take 100 mg by mouth daily as needed for mild constipation.    Marland Kitchen escitalopram (LEXAPRO) 20 MG tablet Take 1 tablet (20 mg total) by mouth daily. 90 tablet 3  . furosemide (LASIX) 20 MG tablet Take 1 tablet by mouth every 24 hours as needed for edema. Take 20 mg for 3 days if 3 lb weight gain and take 20 mg for 7 days for 5 lb weight gain.    Marland Kitchen gabapentin (NEURONTIN) 300 MG capsule Take 1 capsule (300 mg total) by mouth 2 (two) times daily. 60 capsule 11  . levothyroxine (SYNTHROID, LEVOTHROID) 88 MCG tablet Take 88 mcg by mouth daily before breakfast.     . magnesium oxide (MAG-OX) 400 MG tablet Take 400 mg by mouth daily.    . Melatonin 3 MG TABS Take 3 mg by mouth at bedtime.    . metFORMIN (GLUCOPHAGE-XR) 500 MG 24 hr tablet Take 500 mg by mouth every evening.     Marland Kitchen omeprazole (PRILOSEC) 40 MG capsule Take 40 mg by mouth daily.    . Polyethylene Glycol 3350-GRX POWD Take by mouth as needed.    Marland Kitchen POLYSACCHARIDE IRON COMPLEX PO 150-0.025-1mg  - one capsule twice daily    . rivaroxaban (XARELTO) 20 MG TABS tablet Take 20 mg by mouth daily with supper.    . telmisartan (MICARDIS) 40 MG tablet Take 40 mg by mouth daily.     No current facility-administered medications for this visit.     PAST MEDICAL HISTORY: Past Medical History:  Diagnosis Date  . Anemia   . Anxiety   . Atrial fibrillation ([place])   . Cataract   . Diabetes mellitus without complication ([place])   . Hyperlipidemia   . Hypertension   . Parkinson disease ([place])   . Thyroid disorder     PAST SURGICAL  HISTORY: Past Surgical History:  Procedure Laterality Date  . APPENDECTOMY    . CESAREAN SECTION    . FEMUR FRACTURE SURGERY Right   . KNEE SURGERY Bilateral   . THYROIDECTOMY, PARTIAL    . vein sugrery      FAMILY HISTORY: Family History  Problem Relation Age of Onset  . Breast cancer Mother   . Heart disease Father     SOCIAL HISTORY:  Social History   Socioeconomic History  . Marital status: Widowed    Spouse name: Not on file  . Number of children: 4  . Years of education: 70  . Highest education level: High school graduate  Occupational History  . Occupation: Retired  Scientific laboratory technician  . Financial resource strain: Not on file  . Food insecurity    Worry: Not on file    Inability: Not on file  . Transportation needs    Medical: Not  on file    Non-medical: Not on file  Tobacco Use  . Smoking status: Never Smoker  . Smokeless tobacco: Never Used  . Tobacco comment: NEVER SMOKED  Substance and Sexual Activity  . Alcohol use: Never    Alcohol/week: 0.0 standard drinks    Frequency: Never  . Drug use: Never  . Sexual activity: Never  Lifestyle  . Physical activity    Days per week: Not on file    Minutes per session: Not on file  . Stress: Not on file  Relationships  . Social Musicianconnections    Talks on phone: Not on file    Gets together: Not on file    Attends religious service: Not on file    Active member of club or organization: Not on file    Attends meetings of clubs or organizations: Not on file    Relationship status: Not on file  . Intimate partner violence    Fear of current or ex partner: Not on file    Emotionally abused: Not on file    Physically abused: Not on file    Forced sexual activity: Not on file  Other Topics Concern  . Not on file  Social History Narrative   Right-handed.   [facility]    Decaf coffee & drinks some diet pepsi     PHYSICAL EXAM   Vitals:   08/21/19 1315  BP:  117/68  Pulse: 78  Temp: 98.4 F (36.9 C)  Weight: 199 lb 8 oz (90.5 kg)  Height: 5\' 9"  (1.753 m)    Not recorded      Body mass index is 29.46 kg/m.  PHYSICAL EXAMNIATION:  Gen: NAD, conversant, well nourised, well groomed                     Cardiovascular: Regular rate rhythm, warm, nontender. Eyes: Conjunctivae clear without exudates or hemorrhage Neck: Supple, no carotid bruits. Pulmonary: Clear to auscultation bilaterally   MMSE - Mini Mental State Exam 08/21/2019 12/17/2018 03/13/2018  Orientation to time 5 5 5   Orientation to Place 5 5 5   Registration 3 3 3   Attention/ Calculation 3 4 3   Recall 1 3 2   Language- name 2 objects 2 2 2   Language- repeat 1 1 1   Language- follow 3 step command 3 3 3   Language- read & follow direction 1 1 1   Write a sentence 1 1 1   Copy design 0 0 1  Total score 25 28 27   animal naming 6 NEUROLOGICAL EXAM:  CRANIAL NERVES: Mild masked face, microphonia CN II: Visual fields are full to confrontation. Pupils are round equal and briskly reactive to light. CN III, IV, VI: extraocular movement are normal. No ptosis. CN V: Facial sensation is intact to pinprick in all 3 divisions bilaterally. Corneal responses are intact.  CN VII: Face is symmetric with normal eye closure and smile. CN VIII: Hearing is normal to rubbing fingers CN IX, X: Palate elevates symmetrically. Phonation is normal. CN XI: Head turning and shoulder shrug are intact CN XII: Tongue is midline with normal movements and no atrophy.  MOTOR: Mild bilateral hand posturing tremor, able to move upper and extremity against gravity, limited range of motion of right proximal arm due to right shoulder pain, mild to moderate rigidity and bradykinesia, right worse than left  REFLEXES: Reflexes are hypoactive and symmetric at the biceps, triceps, knees, and ankles. Plantar responses are flexor.  COORDINATION:  There is no dysmetria on finger-to-nose and heel-knee-shin but is  slowed.    GAIT/STANCE: She needs assistance to get up from seated position, rely on her walker, leaning forward, small stride, en bloc turning  DIAGNOSTIC DATA (LABS, IMAGING, TESTING) - I reviewed patient records, labs, notes, testing and imaging myself where available.   ASSESSMENT AND PLAN  WILSON SAMPLE is a 78 y.o. female   Parkinson's disease Mild cognitive impairment  Mini-Mental Status Examination 25 /30 Restless leg symptoms  Increase Sinemet 25/100 mg to 1 and half tablet 4 times a day  Gabapentin 300 mg at night  Refer to physical therapy Worsening anxiety  Add on Celexa 10 mg daily  Levert Feinstein, M.D. Ph.D.  [facility] [address] [place], [place] [address] Phone: [phone] Fax:      [phone]

## 2019-11-18 ENCOUNTER — Encounter: Payer: Self-pay | Admitting: Neurology

## 2019-12-05 ENCOUNTER — Ambulatory Visit: Payer: Medicare HMO | Admitting: Neurology

## 2020-02-03 ENCOUNTER — Telehealth: Payer: Self-pay | Admitting: Neurology

## 2020-02-03 NOTE — Telephone Encounter (Signed)
K[place], Claudia Finley(daughter on DPR) has called re: pt's hallucinations worsening.  Claudia Finley states this happens after dark and during pt's sleep. Claudia Finley also says pt is anxious, daughter is wanting to know if there can be a change in meds since pt is worsening or do they need to wait until appointment.  Please call

## 2020-02-03 NOTE — Telephone Encounter (Signed)
I called her dgt back and she is going to call the facility PCP today and request an evaluation to rule out the reasons below.

## 2020-02-03 NOTE — Telephone Encounter (Signed)
Agree to rule out UTI by her PCP, and also the other possibility such as dehydration, constipation, new medications,  If she really has hard time, may consider adding Seroquel 25 mg every night

## 2020-02-03 NOTE — Telephone Encounter (Signed)
I spoke to the patient's dgt who feels her mother has experienced a worsening of hallucinations, confusion and anxiety over the last two weeks. She is having more pronounced changes in the evening. She was last seen 08/21/19. She has a pending appt on 02/11/20 and earlier times were offered today on the phone but she was unable to attend them w/ her mother. For now, she would like to keep the pending appt next week. In the meantime, I encouraged her to discuss changes w/ the facility PCP (resides at [facility]). They may need to check a UA to rule out UTI since these changes just occurred recently.

## 2020-02-11 ENCOUNTER — Other Ambulatory Visit: Payer: Self-pay

## 2020-02-11 ENCOUNTER — Telehealth: Payer: Self-pay | Admitting: Neurology

## 2020-02-11 ENCOUNTER — Ambulatory Visit (INDEPENDENT_AMBULATORY_CARE_PROVIDER_SITE_OTHER): Payer: Medicare HMO | Admitting: Neurology

## 2020-02-11 ENCOUNTER — Encounter: Payer: Self-pay | Admitting: Neurology

## 2020-02-11 VITALS — BP 138/73 | HR 82 | Temp 97.3°F

## 2020-02-11 DIAGNOSIS — R443 Hallucinations, unspecified: Secondary | ICD-10-CM

## 2020-02-11 DIAGNOSIS — G20 Parkinson's disease: Secondary | ICD-10-CM | POA: Diagnosis not present

## 2020-02-11 NOTE — Patient Instructions (Signed)
Let's get the work up from [employer] current medications  See you back in 3 months

## 2020-02-11 NOTE — Telephone Encounter (Signed)
Can you call Claudia Finley, check on status of urine and blood work for possible infection, new onset hallucinations?

## 2020-02-11 NOTE — Progress Notes (Signed)
PATIENT: Claudia Finley DOB: 07-20-1941  REASON FOR VISIT: follow up HISTORY FROM: patient  HISTORY OF PRESENT ILLNESS: Today 02/11/20  HISTORY Claudia Finley is a 79 year old female, seen in refer by her primary care doctor Tiana Loft B to follow-up for Parkinson's disease, she is accompanied by her daughter Theodoro Grist at today's visit, initial evaluation was on March 13, 2018.  Initial visit 03/13/2018  YY: She has past medical history of hypertension, hyperlipidemia, chronic atrial fibrillation, on chronic Xarelto treatment, denied previous history of coronary artery disease or stroke, somehow she is also on aspirin 81 mg daily, complains of frequent bruise, also recent worsening bilateral lower extremity edema. She was seen by Dr. Sabra Heck since 2015 because of gradual onset bilateral hands tremor, diffuse weakness, gait abnormality, decreased facial expression per record, micrographia, she was diagnosed with Parkinson's disease, initially was treated with Requip 0.25 mg gradually titrating up to current 2 mg 3 times a day, Sinemet was introduced around 2017, now taking 1 tablet 4 tabs daily She fell, suffered a right femur fracture in 2017, has relied on her walker since.  She moved to [facility] since December 2018, with her husband, unfortunately, her husband passed away in 10-Jan-2018, she went through a lot of changes recently, She now complains of diffuse body achy pain, increased right shoulder pain, had a history of right rotator cuff syndrome, she also complains of diffuse joint pain, especially right knee, slow moving, she can still ambulate with her walker to the dining hall, along the hallway, dressing, but has worsening urinary incontinence, wearing depends, require help with bathing. She was also noted to have slow worsening memory loss, MRI was normal in February 2015, Echocardiogram in March 2019 showed normal left ventricular site systolic function was no  appreciable segmental abnormality, ejection fraction was 60%, moderate tricuspid regurgitation.  UPDATE Dec 17 2018: Requip was stopped in April 2019 for increased bilateral lower extremity swelling, she reported worsening restless leg symptoms, failed to improve restarting Requip at 4 mg at nighttime, throughout the day her legs were jumpy, worsened at nighttime, difficulty sleeping, also complains of increased anxiety,  She is with her daughter Coralyn Mark and Judeen Hammans at today's visit, sleeps well at nighttime, Mini-Mental status 28/30.  She has words finding difficulty, mild memory loss, continue complains of evening and nighttime restless leg symptoms, leg jumping, difficulty holding still  UPDATE Sept 23 2020: She is accompanied by her daughter Coralyn Mark at today's clinical visit, last visit was through telephone in April 2020, she lives in [facility], since [place] in March 2020, she was not able to see her family regularly, noted increased confusion, anxiety, she is taking Sinemet 25/100 mg 4 times a day, which is helpful, gabapentin 300 mg twice daily because increased daytime sleepiness, she is doing better since decreased to 300 mg every night,   Update February 11, 2020 SS: She is here today accompanied by her 2 daughters, Judeen Hammans and Coralyn Mark.  She lives at [facility].  For the last 2 weeks, has reported increased confusion, anxiety, hallucinations in the evening and at bedtime.  She reports seeing a woman every night, sleeping on the couch.  At first, it scared her, now she has gotten accustomed to it, but it keeps her up.  She was seen by PCP at the facility, urine and blood work was done, but the results are still pending.  Her family has not been able to see her at the facility  in about a year, due to pandemic restrictions.   In December 2020, she went to the hospital was admitted for lethargy, abdominal pain, was found to be constipated, had 2 episodes of vomiting, improved after bowel  movement.  She has difficulty with her pronouns when speaking, she reports she has been constipated, had large bowel movement today.  Denies any signs of infection, no fever, chills.  She uses a walker for ambulation.  She has been having more trouble using the cell phone and remote.  She has not had any falls.  She denies freezing episodes.  After last visit with Dr. Terrace Arabia, Sinemet 25/100 was increased to 1.5 tablets 4 times daily, add on Celexa 10 mg daily.  Following hospitalization December, did physical therapy, but never regained full function.  MMSE was 21/30 today.  REVIEW OF SYSTEMS: Out of a complete 14 system review of symptoms, the patient complains only of the following symptoms, and all other reviewed systems are negative.  Hallucinations   ALLERGIES: Allergies  Allergen Reactions  . Benztropine     Other reaction(s): Hallucinations Stomach cramps Stomach cramps   . Escitalopram     Hallucinations    HOME MEDICATIONS: Outpatient Medications Prior to Visit  Medication Sig Dispense Refill  . acetaminophen (TYLENOL) 500 MG tablet Take 500 mg by mouth every 6 (six) hours as needed.    Marland Kitchen atorvastatin (LIPITOR) 40 MG tablet Take 40 mg by mouth daily.    Marland Kitchen BIOTIN PO Take 1,000 mcg by mouth daily.    . carbidopa-levodopa (SINEMET IR) 25-100 MG tablet Take 1.5 tablets by mouth 4 (four) times daily. 540 tablet 4  . Cholecalciferol (VITAMIN D3) 2000 units TABS Take 2,000 Units by mouth daily.    . citalopram (CELEXA) 10 MG tablet Take 1 tablet (10 mg total) by mouth daily. 90 tablet 4  . Cyanocobalamin (VITAMIN B-12 IJ) Inject 1,000 Units as directed every 30 (thirty) days.    Marland Kitchen diltiazem (CARDIZEM LA) 180 MG 24 hr tablet Take 180 mg by mouth daily.    Marland Kitchen docusate sodium (COLACE) 100 MG capsule Take 100 mg by mouth daily as needed for mild constipation.    Marland Kitchen escitalopram (LEXAPRO) 20 MG tablet Take 1 tablet (20 mg total) by mouth daily. 90 tablet 3  . furosemide (LASIX) 20 MG tablet  Take 1 tablet by mouth every 24 hours as needed for edema. Take 20 mg for 3 days if 3 lb weight gain and take 20 mg for 7 days for 5 lb weight gain.    Marland Kitchen gabapentin (NEURONTIN) 300 MG capsule Take 1 capsule (300 mg total) by mouth at bedtime. 90 capsule 4  . levothyroxine (SYNTHROID, LEVOTHROID) 88 MCG tablet Take 88 mcg by mouth daily before breakfast.     . magnesium oxide (MAG-OX) 400 MG tablet Take 400 mg by mouth daily.    . Melatonin 3 MG TABS Take 3 mg by mouth at bedtime.    . metFORMIN (GLUCOPHAGE-XR) 500 MG 24 hr tablet Take 500 mg by mouth every evening.     Marland Kitchen omeprazole (PRILOSEC) 40 MG capsule Take 40 mg by mouth daily.    . Polyethylene Glycol 3350-GRX POWD Take by mouth as needed.    Marland Kitchen POLYSACCHARIDE IRON COMPLEX PO 150-0.025-1mg  - one capsule twice daily    . rivaroxaban (XARELTO) 20 MG TABS tablet Take 20 mg by mouth daily with supper.    . telmisartan (MICARDIS) 40 MG tablet Take 40 mg by mouth daily.  No facility-administered medications prior to visit.    PAST MEDICAL HISTORY: Past Medical History:  Diagnosis Date  . Anemia   . Anxiety   . Atrial fibrillation (HCC)   . Cataract   . Diabetes mellitus without complication (HCC)   . Hyperlipidemia   . Hypertension   . Parkinson disease (HCC)   . Thyroid disorder     PAST SURGICAL HISTORY: Past Surgical History:  Procedure Laterality Date  . APPENDECTOMY    . CESAREAN SECTION    . FEMUR FRACTURE SURGERY Right   . KNEE SURGERY Bilateral   . THYROIDECTOMY, PARTIAL    . vein sugrery      FAMILY HISTORY: Family History  Problem Relation Age of Onset  . Breast cancer Mother   . Heart disease Father     SOCIAL HISTORY: Social History   Socioeconomic History  . Marital status: Widowed    Spouse name: Not on file  . Number of children: 4  . Years of education: 52  . Highest education level: High school graduate  Occupational History  . Occupation: Retired  Tobacco Use  . Smoking status: Never  Smoker  . Smokeless tobacco: Never Used  . Tobacco comment: NEVER SMOKED  Substance and Sexual Activity  . Alcohol use: Never    Alcohol/week: 0.0 standard drinks  . Drug use: Never  . Sexual activity: Never  Other Topics Concern  . Not on file  Social History Narrative   Right-handed.   Lives at [facility]    Decaf coffee & drinks some diet pepsi   Social Determinants of Health   Financial Resource Strain:   . Difficulty of Paying Living Expenses:   Food Insecurity:   . Worried About Programme researcher, broadcasting/film/video in the Last Year:   . Barista in the Last Year:   Transportation Needs:   . Freight forwarder (Medical):   Marland Kitchen Lack of Transportation (Non-Medical):   Physical Activity:   . Days of Exercise per Week:   . Minutes of Exercise per Session:   Stress:   . Feeling of Stress :   Social Connections:   . Frequency of Communication with Friends and Family:   . Frequency of Social Gatherings with Friends and Family:   . Attends Religious Services:   . Active Member of Clubs or Organizations:   . Attends Banker Meetings:   Marland Kitchen Marital Status:   Intimate Partner Violence:   . Fear of Current or Ex-Partner:   . Emotionally Abused:   Marland Kitchen Physically Abused:   . Sexually Abused:    PHYSICAL EXAM  Vitals:   02/11/20 1526  BP: 138/73  Pulse: 82  Temp: (!) 97.3 F (36.3 C)   There is no height or weight on file to calculate BMI.  Generalized: Well developed, in no acute distress  MMSE - Mini Mental State Exam 02/11/2020 08/21/2019 12/17/2018  Orientation to time 3 5 5   Orientation to Place 4 5 5   Registration 3 3 3   Attention/ Calculation 1 3 4   Recall 2 1 3   Language- name 2 objects 2 2 2   Language- repeat 1 1 1   Language- follow 3 step command 3 3 3   Language- read & follow direction 1 1 1   Write a sentence 1 1 1   Copy design 0 0 0  Total score 21 25 28      Neurological examination  Mentation: Alert oriented  to time, place, mostly  able to provide history, supported by her daughters. Follows all commands speech, mixes pronouns, mild masking of the face is seen Cranial nerve II-XII: Pupils were equal round reactive to light. Extraocular movements were full, visual field were full on confrontational test. Facial sensation and strength were normal. Head turning and shoulder shrug were normal and symmetric. Motor: Good strength in all extremities, right upper extremity limited due to pain of the shoulder, right bradykinesia noted Sensory: Sensory testing is intact to soft touch on all 4 extremities. No evidence of extinction is noted.  Coordination: Cerebellar testing reveals good finger-nose-finger and heel-to-shin bilaterally. Mild right hand postural tremor noted. Gait and station: In a wheelchair, is able to rock back and forth, stand up with examiner assistance, able to take a few steps in the room with examiner, did not bring walker Reflexes: Deep tendon reflexes are hypoactive  DIAGNOSTIC DATA (LABS, IMAGING, TESTING) - I reviewed patient records, labs, notes, testing and imaging myself where available.  Lab Results  Component Value Date   WBC 4.6 10/31/2018   HGB 12.3 10/31/2018   HCT 38.5 10/31/2018   MCV 98.2 10/31/2018   PLT 118 (L) 10/31/2018      Component Value Date/Time   NA 137 10/31/2018 1425   K 4.2 10/31/2018 1425   CL 101 10/31/2018 1425   CO2 27 10/31/2018 1425   GLUCOSE 124 (H) 10/31/2018 1425   BUN 12 10/31/2018 1425   CREATININE 0.83 10/31/2018 1425   CALCIUM 9.3 10/31/2018 1425   GFRNONAA >60 10/31/2018 1425   GFRAA >60 10/31/2018 1425   No results found for: CHOL, HDL, LDLCALC, LDLDIRECT, TRIG, CHOLHDL No results found for: LSLH7D No results found for: VITAMINB12 No results found for: TSH    ASSESSMENT AND PLAN 79 y.o. year old female  has a past medical history of Anemia, Anxiety, Atrial fibrillation (HCC), Cataract, Diabetes mellitus without complication  (HCC), Hyperlipidemia, Hypertension, Parkinson disease (HCC), and Thyroid disorder. here with:  1.  Parkinson's disease  2.  Restless leg symptoms 3.  Mild cognitive impairment 4.  Anxiety -For the last 2 to 3 weeks, increased confusion, hallucinations, in the evening -Urinalysis, blood work was done at [facility], has not resulted, will reach out to facility to obtain results, did have large BM today, question if constipation was a factor? -If evaluation for infection is unremarkable, will consider Seroquel low-dose 12.5 mg at bedtime for hallucinations -Continue Sinemet 25/100 mg 1.5 tablets 4 times daily -Continue gabapentin 300 mg at bedtime -Continue Celexa 10 mg daily -Follow-up in 3 months with Dr. Terrace Arabia or sooner if needed   I spent 25 minutes with the patient. 50% of this time was spent discussing her plan of care.    Margie Ege, AGNP-C, DNP 02/11/2020, 3:51 PM [facility] [address], [address] [place], [place] [address] [phone]

## 2020-02-12 MED ORDER — QUETIAPINE FUMARATE 25 MG PO TABS: 12.5000 mg | ORAL_TABLET | Freq: Every day | ORAL | 3 refills | Status: AC

## 2020-02-12 NOTE — Telephone Encounter (Signed)
I have spoken to her other daugher, Teliyah Royal, and she is aware of the updated plan below.

## 2020-02-12 NOTE — Telephone Encounter (Signed)
I spoke to Lurena Joiner, Charity fundraiser at [employer] at [employer] [phone]). The patient's PCP reviewed her lab work and UA this morning. Both came back okay with no indication of infection.  If Seroquel is started, then the orders can be added to the Provider Visit Form that we have here and faxed to her attention at [phone].

## 2020-02-12 NOTE — Telephone Encounter (Signed)
The Seroquel 12.5mg  rx has been faxed and confirmed to [place], attention Lurena Joiner.  I also called her daughter [place] Siren on [place]). I left a detailed message notifying her of this therapy change. Provided our number to call back with any questions.

## 2020-02-12 NOTE — Telephone Encounter (Signed)
Let's add on Seroquel, but I will do low dose 12.5 mg at bedtime to see if that helps with hallucinations. We can always go up to 25 mg if needed

## 2020-02-12 NOTE — Addendum Note (Signed)
Addended by: Glean Salvo on: 02/12/2020 12:33 PM   Modules accepted: Orders

## 2020-02-14 NOTE — Progress Notes (Signed)
I have reviewed and agreed above plan. 

## 2020-03-25 ENCOUNTER — Ambulatory Visit: Payer: Medicare HMO | Admitting: Neurology

## 2020-04-28 ENCOUNTER — Telehealth: Payer: Self-pay | Admitting: Neurology

## 2020-04-28 NOTE — Telephone Encounter (Signed)
I called daughter back and relayed that SS/NP stated that could do higher dose of seroquel, but if hallucinations are bothersome or dangerous but other wise would not change at this time.   The multiple changes in venue, UTI can definitely contribute to what she is experiencing.  She will monitor things and let us know.  I relayed that med increase was possible if needed.  She verbalized understanding.

## 2020-04-28 NOTE — Telephone Encounter (Signed)
Pt's daughter [name] on [place] called asking to speak to the RN about a possible medication dosage change. Please advise.

## 2020-04-28 NOTE — Telephone Encounter (Signed)
She has follow-up with Dr. Terrace Arabia 05/21/2020. We could do higher dose Seroquel 25 mg qhs if needed. Make sure UTI has cleared, no other contributory factors, but change in setting/hospitalization may be cause. If hallucinations are not bothersome/dangerous, make leave medications as is. Has primary doctor evaluated her for any acute causes.

## 2020-04-28 NOTE — Telephone Encounter (Signed)
Spoke to daughter, pt had UTI was hospitalized 7 days then rehab in [facility].  They are able to see pt 30 min in wheelchair.  Rehab states doing well in PT, although is distracted, noted increased hallucinations. (day and night).  Realize that changes make things worse.  Any recommendations?  On seroquel  12.5mg  po qhs. Lexapro 20mg ? celexa 10 daily.

## 2020-05-21 ENCOUNTER — Ambulatory Visit: Payer: Medicare HMO | Admitting: Neurology

## 2020-06-24 ENCOUNTER — Ambulatory Visit: Payer: Medicare HMO | Admitting: Neurology

## 2020-06-24 ENCOUNTER — Encounter: Payer: Self-pay | Admitting: Neurology

## 2020-06-24 ENCOUNTER — Other Ambulatory Visit: Payer: Self-pay

## 2020-06-24 VITALS — BP 97/60 | HR 62 | Ht 67.0 in | Wt 182.0 lb

## 2020-06-24 DIAGNOSIS — F0391 Unspecified dementia with behavioral disturbance: Secondary | ICD-10-CM

## 2020-06-24 DIAGNOSIS — R269 Unspecified abnormalities of gait and mobility: Secondary | ICD-10-CM | POA: Diagnosis not present

## 2020-06-24 DIAGNOSIS — G20A1 Parkinson's disease without dyskinesia, without mention of fluctuations: Secondary | ICD-10-CM

## 2020-06-24 DIAGNOSIS — R443 Hallucinations, unspecified: Secondary | ICD-10-CM

## 2020-06-24 DIAGNOSIS — G20 Parkinson's disease: Secondary | ICD-10-CM | POA: Diagnosis not present

## 2020-06-24 DIAGNOSIS — F03918 Unspecified dementia, unspecified severity, with other behavioral disturbance: Secondary | ICD-10-CM | POA: Insufficient documentation

## 2020-06-24 NOTE — Progress Notes (Signed)
HISTORY Claudia Finley is a 79 year old female, seen in refer by her primary care doctor Derrell Lolling, [name] to follow-up for Parkinson's disease, she is accompanied by her daughter [name] at today's visit, initial evaluation was on March 13, 2018.  She has past medical history of hypertension,  hyperlipidemia,  chronic atrial fibrillation, on chronic Xarelto treatment, denied previous history of coronary artery disease or stroke, somehow she is also on aspirin 81 mg daily, complains of frequent bruise, also recent worsening bilateral lower extremity edema.   She was seen by Dr. [name] since 2015 because of gradual onset bilateral hands tremor, diffuse weakness, gait abnormality, decreased facial expression per record, micrographia, she was diagnosed with Parkinson's disease, initially was treated with Requip 0.25 mg gradually titrating up to current 2 mg 3 times a day,   Sinemet was introduced around 2017, now taking 1 tablet 4 tabs daily She fell, suffered a right femur fracture in 2017, has relied on her walker since.    She moved to assistant living [place] since December 2018, with her husband, unfortunately, her husband passed away in 01/24/19she went through a lot of changes recently,  She now complains of diffuse body achy pain, increased right shoulder pain, had a history of right rotator cuff syndrome, she also complains of diffuse joint pain, especially right knee, slow moving, she can still ambulate with her walker to the dining hall, along the hallway, dressing, but has worsening urinary incontinence, wearing depends, require help with bathing. She was also noted to have slow worsening memory loss,   MRI was normal in February 2015,  Echocardiogram in March 2019 showed normal left ventricular site systolic function was no appreciable segmental abnormality, ejection fraction was 60%, moderate tricuspid regurgitation.  UPDATE Dec 17 2018: Requip was stopped in April  2019 for increased bilateral lower extremity swelling, she reported worsening restless leg symptoms, failed to improve restarting Requip at 4 mg at nighttime, throughout the day her legs were jumpy, worsened at nighttime, difficulty sleeping, also complains of increased anxiety,  She is with her daughter [name] and [name] at today's visit, sleeps well at nighttime, Mini-Mental status 28/30.  She has words finding difficulty, mild memory loss, continue complains of evening and nighttime restless leg symptoms, leg jumping, difficulty holding still  UPDATE Sept 23 2020: She is accompanied by her daughter [name] at today's clinical visit, last visit was through telephone in April 2020, she lives in [facility], since [place] in March 2020, she was not able to see her family regularly, noted increased confusion, anxiety, she is taking Sinemet 25/100 mg 4 times a day, which is helpful, gabapentin 300 mg twice daily because increased daytime sleepiness, she is doing better since decreased to 300 mg every night,   UPDATE June 24 2020: In December 2020, she went to the hospital was admitted for lethargy, abdominal pain, was found to be constipated, had 2 episodes of vomiting, improved after bowel movement.  Family reported increased confusion at last visit in March 2021, Sinemet dose was increased to 25/100 mg from 1-1 and half tablets 4 times a day, also add on Seroquel 25mg  qhs.  She was [facility], followed by rehab for 3 weeks, had severe dehydration, there was confuse of her medication during the process, seroquel was stopped, she was noted to have increased neck, she finally back to [place], now back to seroquel 25mg  qhs started on trazodone 50 mg 1/2  tablet every night since May 20, 2020,  She is again treated with macrobid since June 19 2020 for recurrent UTI, complains of generalized fatigue, blood pressure is on the low normal range 96/60  REVIEW OF  SYSTEMS: Out of a complete 14 system review of symptoms, the patient complains only of the following symptoms, and all other reviewed systems are negative.  Hallucinations   ALLERGIES: Allergies  Allergen Reactions  . Benztropine     Other reaction(s): Hallucinations Stomach cramps Stomach cramps   . Escitalopram     Hallucinations    HOME MEDICATIONS: Outpatient Medications Prior to Visit  Medication Sig Dispense Refill  . acetaminophen (TYLENOL) 500 MG tablet Take 500 mg by mouth every 6 (six) hours as needed.    Marland Kitchen atorvastatin (LIPITOR) 40 MG tablet Take 40 mg by mouth daily.    . carbidopa-levodopa (SINEMET IR) 25-100 MG tablet Take 1.5 tablets by mouth 4 (four) times daily. 540 tablet 4  . Cholecalciferol (VITAMIN D3) 2000 units TABS Take 2,000 Units by mouth daily.    . citalopram (CELEXA) 10 MG tablet Take 1 tablet (10 mg total) by mouth daily. 90 tablet 4  . Cyanocobalamin (VITAMIN B-12 IJ) Inject 1,000 Units as directed every 30 (thirty) days.    Marland Kitchen diltiazem (CARDIZEM LA) 180 MG 24 hr tablet Take 180 mg by mouth daily.    Marland Kitchen docusate sodium (COLACE) 100 MG capsule Take 100 mg by mouth daily as needed for mild constipation.    Marland Kitchen escitalopram (LEXAPRO) 20 MG tablet Take 1 tablet (20 mg total) by mouth daily. 90 tablet 3  . furosemide (LASIX) 20 MG tablet Take 1 tablet by mouth every 24 hours as needed for edema. Take 20 mg for 3 days if 3 lb weight gain and take 20 mg for 7 days for 5 lb weight gain.    Marland Kitchen gabapentin (NEURONTIN) 300 MG capsule Take 1 capsule (300 mg total) by mouth at bedtime. 90 capsule 4  . levothyroxine (SYNTHROID, LEVOTHROID) 88 MCG tablet Take 88 mcg by mouth daily before breakfast.     . magnesium oxide (MAG-OX) 400 MG tablet Take 400 mg by mouth daily.    . Melatonin 3 MG TABS Take 3 mg by mouth at bedtime.    . nitrofurantoin, macrocrystal-monohydrate, (MACROBID) 100 MG capsule Take 100 mg by mouth 2 (two) times daily.    Marland Kitchen omeprazole (PRILOSEC) 40 MG  capsule Take 40 mg by mouth daily.    . Polyethylene Glycol 3350-GRX POWD Take by mouth as needed.    Marland Kitchen POLYSACCHARIDE IRON COMPLEX PO 150-0.025-1mg  - one capsule twice daily    . QUEtiapine (SEROQUEL) 25 MG tablet Take 0.5 tablets (12.5 mg total) by mouth at bedtime. 30 tablet 3  . rivaroxaban (XARELTO) 20 MG TABS tablet Take 20 mg by mouth daily with supper.    . telmisartan (MICARDIS) 40 MG tablet Take 40 mg by mouth daily.    . TRAZODONE HCL PO Take by mouth.    Marland Kitchen BIOTIN PO Take 1,000 mcg by mouth daily.    . metFORMIN (GLUCOPHAGE-XR) 500 MG 24 hr tablet Take 500 mg by mouth every evening.      No facility-administered medications prior to visit.    PAST MEDICAL HISTORY: Past Medical History:  Diagnosis Date  . Anemia   . Anxiety   . Atrial fibrillation (HCC)   . Cataract   . Diabetes mellitus without complication (HCC)   . Hyperlipidemia   . Hypertension   .  Parkinson disease (HCC)   . Thyroid disorder     PAST SURGICAL HISTORY: Past Surgical History:  Procedure Laterality Date  . APPENDECTOMY    . CESAREAN SECTION    . FEMUR FRACTURE SURGERY Right   . KNEE SURGERY Bilateral   . THYROIDECTOMY, PARTIAL    . vein sugrery      FAMILY HISTORY: Family History  Problem Relation Age of Onset  . Breast cancer Mother   . Heart disease Father     SOCIAL HISTORY: Social History   Socioeconomic History  . Marital status: Widowed    Spouse name: Not on file  . Number of children: 4  . Years of education: 8212  . Highest education level: High school graduate  Occupational History  . Occupation: Retired  Tobacco Use  . Smoking status: Never Smoker  . Smokeless tobacco: Never Used  . Tobacco comment: NEVER SMOKED  Vaping Use  . Vaping Use: Never used  Substance and Sexual Activity  . Alcohol use: Never    Alcohol/week: 0.0 standard drinks  . Drug use: Never  . Sexual activity: Never  Other Topics Concern  . Not on file  Social History Narrative   Right-handed.    [facility]    Decaf coffee & drinks some diet pepsi   Social Determinants of Health   Financial Resource Strain:   . Difficulty of Paying Living Expenses:   Food Insecurity:   . Worried About Programme researcher, broadcasting/film/videounning Out of Food in the Last Year:   . Baristaan Out of Food in the Last Year:   Transportation Needs:   . Freight forwarderLack of Transportation (Medical):   Marland Kitchen. Lack of Transportation (Non-Medical):   Physical Activity:   . Days of Exercise per Week:   . Minutes of Exercise per Session:   Stress:   . Feeling of Stress :   Social Connections:   . Frequency of Communication with Friends and Family:   . Frequency of Social Gatherings with Friends and Family:   . Attends Religious Services:   . Active Member of Clubs or Organizations:   . Attends BankerClub or Organization Meetings:   Marland Kitchen. Marital Status:   Intimate Partner Violence:   . Fear of Current or Ex-Partner:   . Emotionally Abused:   Marland Kitchen. Physically Abused:   . Sexually Abused:    PHYSICAL EXAM  Vitals:   06/24/20 1234  BP: (!) 97/60  Pulse: 62  Weight: 182 lb (82.6 kg)  Height: 5\' 7"  (1.702 m)   Body mass index is 28.51 kg/m.  Generalized: Well developed, in no acute distress  MMSE - Mini Mental State Exam 02/11/2020 08/21/2019 12/17/2018  Orientation to time 3 5 5   Orientation to Place 4 5 5   Registration 3 3 3   Attention/ Calculation 1 3 4   Recall 2 1 3   Language- name 2 objects 2 2 2   Language- repeat 1 1 1   Language- follow 3 step command 3 3 3   Language- read & follow direction 1 1 1   Write a sentence 1 1 1   Copy design 0 0 0  Total score 21 25 28     PHYSICAL EXAMNIATION:  Gen: NAD, conversant, well nourised, well groomed                     Cardiovascular: Regular rate rhythm, no peripheral edema, warm, nontender. Eyes: Conjunctivae clear without exudates or hemorrhage Neck: Supple, no carotid bruits. Pulmonary: Clear to auscultation bilaterally  NEUROLOGICAL EXAM:  MENTAL  STATUS: Speech/Cognition: Awake, alert, normal speech, oriented to history taking and casual conversation.  CRANIAL NERVES: CN II: Visual fields are full to confrontation.  Pupils are round equal and briskly reactive to light. CN III, IV, VI: extraocular movement are normal. No ptosis. CN V: Facial sensation is intact to light touch. CN VII: Face is symmetric with normal eye closure and smile. CN VIII: Hearing is normal to casual conversation CN IX, X: Palate elevates symmetrically. Phonation is normal. CN XI: Head turning and shoulder shrug are intact CN XII: Tongue is midline with normal movements and no atrophy.  MOTOR: Slow to follow command,  Right more than left rigidity, bradykinesia  REFLEXES: Reflexes are 1  and symmetric at the biceps, triceps, knees and ankles. Plantar responses are flexor.  SENSORY: Intact to light touch, pinprick, positional and vibratory sensation at fingers and toes.  COORDINATION: There is no trunk or limb ataxia.    GAIT/STANCE: Need assistance to get up from seated position, retropulsion, unsteady, relies on her walker   DIAGNOSTIC DATA (LABS, IMAGING, TESTING) - I reviewed patient records, labs, notes, testing and imaging myself where available.  Lab Results  Component Value Date   WBC 4.6 10/31/2018   HGB 12.3 10/31/2018   HCT 38.5 10/31/2018   MCV 98.2 10/31/2018   PLT 118 (L) 10/31/2018      Component Value Date/Time   NA 137 10/31/2018 1425   K 4.2 10/31/2018 1425   CL 101 10/31/2018 1425   CO2 27 10/31/2018 1425   GLUCOSE 124 (H) 10/31/2018 1425   BUN 12 10/31/2018 1425   CREATININE 0.83 10/31/2018 1425   CALCIUM 9.3 10/31/2018 1425   GFRNONAA >60 10/31/2018 1425   GFRAA >60 10/31/2018 1425     ASSESSMENT AND PLAN 79 y.o. year old female Parkinson's disease  Memory loss with confusion Gait abnormality  Specified the sinemet schedule 25/100 mg 1.5 tabs at 7 AM, 11 AM 2 PM, 5 PM  Increase Seroquel to 25 mg 2 tablets  8 PM before her bedtime   Continue physical therapy  Simplify medication regimen, stop trazodone, emphasized importance of increased water intake  Return to clinic in 4 months with nurse practitioner Hessie Diener, M.D. Ph.D.  [facility] [address] [place], [place] [address] Phone: [phone] Fax:      [phone]

## 2020-06-24 NOTE — Progress Notes (Deleted)
PATIENT: Claudia Finley DOB: [birth year]  REASON FOR VISIT: follow up HISTORY FROM: patient  HISTORY OF PRESENT ILLNESS: Today 06/24/20  HISTORY   REVIEW OF SYSTEMS: Out of a complete 14 system review of symptoms, the patient complains only of the following symptoms, and all other reviewed systems are negative.  ALLERGIES: Allergies  Allergen Reactions  . Benztropine     Other reaction(s): Hallucinations Stomach cramps Stomach cramps   . Escitalopram     Hallucinations    HOME MEDICATIONS: Outpatient Medications Prior to Visit  Medication Sig Dispense Refill  . acetaminophen (TYLENOL) 500 MG tablet Take 500 mg by mouth every 6 (six) hours as needed.    Marland Kitchen atorvastatin (LIPITOR) 40 MG tablet Take 40 mg by mouth daily.    Marland Kitchen BIOTIN PO Take 1,000 mcg by mouth daily.    . carbidopa-levodopa (SINEMET IR) 25-100 MG tablet Take 1.5 tablets by mouth 4 (four) times daily. 540 tablet 4  . Cholecalciferol (VITAMIN D3) 2000 units TABS Take 2,000 Units by mouth daily.    . citalopram (CELEXA) 10 MG tablet Take 1 tablet (10 mg total) by mouth daily. 90 tablet 4  . Cyanocobalamin (VITAMIN B-12 IJ) Inject 1,000 Units as directed every 30 (thirty) days.    Marland Kitchen diltiazem (CARDIZEM LA) 180 MG 24 hr tablet Take 180 mg by mouth daily.    Marland Kitchen docusate sodium (COLACE) 100 MG capsule Take 100 mg by mouth daily as needed for mild constipation.    Marland Kitchen escitalopram (LEXAPRO) 20 MG tablet Take 1 tablet (20 mg total) by mouth daily. 90 tablet 3  . furosemide (LASIX) 20 MG tablet Take 1 tablet by mouth every 24 hours as needed for edema. Take 20 mg for 3 days if 3 lb weight gain and take 20 mg for 7 days for 5 lb weight gain.    Marland Kitchen gabapentin (NEURONTIN) 300 MG capsule Take 1 capsule (300 mg total) by mouth at bedtime. 90 capsule 4  . levothyroxine (SYNTHROID, LEVOTHROID) 88 MCG tablet Take 88 mcg by mouth daily before breakfast.     . magnesium oxide (MAG-OX) 400 MG tablet Take 400 mg by mouth daily.    .  Melatonin 3 MG TABS Take 3 mg by mouth at bedtime.    . metFORMIN (GLUCOPHAGE-XR) 500 MG 24 hr tablet Take 500 mg by mouth every evening.     Marland Kitchen omeprazole (PRILOSEC) 40 MG capsule Take 40 mg by mouth daily.    . Polyethylene Glycol 3350-GRX POWD Take by mouth as needed.    Marland Kitchen POLYSACCHARIDE IRON COMPLEX PO 150-0.025-1mg  - one capsule twice daily    . QUEtiapine (SEROQUEL) 25 MG tablet Take 0.5 tablets (12.5 mg total) by mouth at bedtime. 30 tablet 3  . rivaroxaban (XARELTO) 20 MG TABS tablet Take 20 mg by mouth daily with supper.    . telmisartan (MICARDIS) 40 MG tablet Take 40 mg by mouth daily.     No facility-administered medications prior to visit.    PAST MEDICAL HISTORY: Past Medical History:  Diagnosis Date  . Anemia   . Anxiety   . Atrial fibrillation (HCC)   . Cataract   . Diabetes mellitus without complication (HCC)   . Hyperlipidemia   . Hypertension   . Parkinson disease (HCC)   . Thyroid disorder     PAST SURGICAL HISTORY: Past Surgical History:  Procedure Laterality Date  . APPENDECTOMY    . CESAREAN SECTION    . FEMUR FRACTURE SURGERY Right   .  KNEE SURGERY Bilateral   . THYROIDECTOMY, PARTIAL    . vein sugrery      FAMILY HISTORY: Family History  Problem Relation Age of Onset  . Breast cancer Mother   . Heart disease Father     SOCIAL HISTORY: Social History   Socioeconomic History  . Marital status: Widowed    Spouse name: Not on file  . Number of children: 4  . Years of education: 55  . Highest education level: High school graduate  Occupational History  . Occupation: Retired  Tobacco Use  . Smoking status: Never Smoker  . Smokeless tobacco: Never Used  . Tobacco comment: NEVER SMOKED  Vaping Use  . Vaping Use: Never used  Substance and Sexual Activity  . Alcohol use: Never    Alcohol/week: 0.0 standard drinks  . Drug use: Never  . Sexual activity: Never  Other Topics Concern  . Not on file  Social History Narrative   Right-handed.    Lives at [facility]    Decaf coffee & drinks some diet pepsi   Social Determinants of Health   Financial Resource Strain:   . Difficulty of Paying Living Expenses:   Food Insecurity:   . Worried About Programme researcher, broadcasting/film/video in the Last Year:   . Barista in the Last Year:   Transportation Needs:   . Freight forwarder (Medical):   Marland Kitchen Lack of Transportation (Non-Medical):   Physical Activity:   . Days of Exercise per Week:   . Minutes of Exercise per Session:   Stress:   . Feeling of Stress :   Social Connections:   . Frequency of Communication with Friends and Family:   . Frequency of Social Gatherings with Friends and Family:   . Attends Religious Services:   . Active Member of Clubs or Organizations:   . Attends Banker Meetings:   Marland Kitchen Marital Status:   Intimate Partner Violence:   . Fear of Current or Ex-Partner:   . Emotionally Abused:   Marland Kitchen Physically Abused:   . Sexually Abused:       PHYSICAL EXAM  There were no vitals filed for this visit. There is no height or weight on file to calculate BMI.  Generalized: Well developed, in no acute distress   Neurological examination  Mentation: Alert oriented to time, place, history taking. Follows all commands speech and language fluent Cranial nerve II-XII: Pupils were equal round reactive to light. Extraocular movements were full, visual field were full on confrontational test. Facial sensation and strength were normal. Uvula tongue midline. Head turning and shoulder shrug  were normal and symmetric. Motor: The motor testing reveals 5 over 5 strength of all 4 extremities. Good symmetric motor tone is noted throughout.  Sensory: Sensory testing is intact to soft touch on all 4 extremities. No evidence of extinction is noted.  Coordination: Cerebellar testing reveals good finger-nose-finger and heel-to-shin bilaterally.  Gait and station: Gait is normal. Tandem gait is  normal. Romberg is negative. No drift is seen.  Reflexes: Deep tendon reflexes are symmetric and normal bilaterally.   DIAGNOSTIC DATA (LABS, IMAGING, TESTING) - I reviewed patient records, labs, notes, testing and imaging myself where available.  Lab Results  Component Value Date   WBC 4.6 10/31/2018   HGB 12.3 10/31/2018   HCT 38.5 10/31/2018   MCV 98.2 10/31/2018   PLT 118 (L) 10/31/2018      Component Value Date/Time   NA  137 10/31/2018 1425   K 4.2 10/31/2018 1425   CL 101 10/31/2018 1425   CO2 27 10/31/2018 1425   GLUCOSE 124 (H) 10/31/2018 1425   BUN 12 10/31/2018 1425   CREATININE 0.83 10/31/2018 1425   CALCIUM 9.3 10/31/2018 1425   GFRNONAA >60 10/31/2018 1425   GFRAA >60 10/31/2018 1425   No results found for: CHOL, HDL, LDLCALC, LDLDIRECT, TRIG, CHOLHDL No results found for: TDHR4B No results found for: VITAMINB12 No results found for: TSH    ASSESSMENT AND PLAN 79 y.o. year old female  has a past medical history of Anemia, Anxiety, Atrial fibrillation (HCC), Cataract, Diabetes mellitus without complication (HCC), Hyperlipidemia, Hypertension, Parkinson disease (HCC), and Thyroid disorder. here with ***   I spent 15 minutes with the patient. 50% of this time was spent   Margie Ege, [place], DNP 06/24/2020, 5:37 AM [facility] [address], [address] [place], [place] [address] [phone]

## 2020-06-24 NOTE — Patient Instructions (Signed)
Specified the sinemet schedule 25/100 mg 1.5 tablets at 7 AM, 11 AM 2 PM, 5 PM   Increase Seroquel to 25 mg 2 tablets 8 PM before her bedtime    Continue physical therapy   Simplify medication regimen, stop trazodone

## 2020-10-27 ENCOUNTER — Other Ambulatory Visit: Payer: Self-pay

## 2020-10-27 ENCOUNTER — Encounter: Payer: Self-pay | Admitting: Neurology

## 2020-10-27 ENCOUNTER — Ambulatory Visit: Payer: Medicare HMO | Admitting: Neurology

## 2020-10-27 VITALS — BP 118/72 | HR 74 | Ht 67.0 in | Wt 165.0 lb

## 2020-10-27 DIAGNOSIS — G20 Parkinson's disease: Secondary | ICD-10-CM | POA: Diagnosis not present

## 2020-10-27 DIAGNOSIS — R269 Unspecified abnormalities of gait and mobility: Secondary | ICD-10-CM | POA: Diagnosis not present

## 2020-10-27 NOTE — Progress Notes (Signed)
PATIENT: Claudia Finley DOB: Apr 28, 1941  REASON FOR VISIT: follow up HISTORY FROM: patient  HISTORY OF PRESENT ILLNESS: Today 10/27/20  HISTORY  Claudia T Ratliffis a 79 year old female, seen in refer by her primary care doctor [name] to follow-up for Parkinson's disease, she is accompanied by her daughter [name] at today's visit, initial evaluation was on March 13, 2018.  She has past medical history of hypertension,  hyperlipidemia,  chronic atrial fibrillation, on chronic Xarelto treatment, denied previous history of coronary artery disease or stroke, somehow she is also on aspirin 81 mg daily, complains of frequent bruise, also recent worsening bilateral lower extremity edema.   She was seen by Dr. [name] since 2015 because of gradual onset bilateral hands tremor, diffuse weakness, gait abnormality, decreased facial expression per record, micrographia, she was diagnosed with Parkinson's disease, initially was treated with Requip 0.25 mg gradually titrating up to current 2 mg 3 times a day,   Sinemet was introduced around 2017, now taking 1 tablet 4 tabs daily She fell, suffered a right femur fracture in 2017, has relied on her walker since.   She moved to [facility] living [place] since December 2018, with her husband, unfortunately, her husband passed away in 2019/01/22she went through a lot of changes recently,  She now complains of diffuse body achy pain, increased right shoulder pain, had a history of right rotator cuff syndrome, she also complains of diffuse joint pain, especially right knee, slow moving, she can still ambulate with her walker to the dining hall, along the hallway, dressing, but has worsening urinary incontinence, wearing depends, require help with bathing. She was also noted to have slow worsening memory loss,   MRI was normal in February 2015,  Echocardiogram in March 2019 showed normal left ventricular site systolic function was  no appreciable segmental abnormality, ejection fraction was 60%, moderate tricuspid regurgitation.  UPDATE Dec 17 2018: Requip was stopped in April 2019 for increased bilateral lower extremity swelling, she reported worsening restless leg symptoms, failed to improve restarting Requip at 4 mg at nighttime, throughout the day her legs were jumpy, worsened at nighttime, difficulty sleeping, also complains of increased anxiety,  She is with her daughter [name] and [name] at today's visit, sleeps well at nighttime, Mini-Mental status 28/30. She has words finding difficulty, mild memory loss, continue complains of evening and nighttime restless leg symptoms, leg jumping, difficulty holding still  UPDATE Sept 23 2020: She is accompanied by her daughter [name] at today's clinical visit, last visit was through telephone in April 2020, she lives in [facility], since [place] in March 2020, she was not able to see her family regularly, noted increased confusion, anxiety, she is taking Sinemet 25/100 mg 4 times a day, which is helpful, gabapentin 300 mg twice daily because increased daytime sleepiness, she is doing better since decreased to 300 mg every night,   UPDATE June 24 2020: In December 2020, she went to the hospital was admitted for lethargy, abdominal pain, was found to be constipated, had 2 episodes of vomiting, improved after bowel movement.  Family reported increased confusion at last visit in March 2021, Sinemet dose was increased to 25/100 mg from 1-1 and half tablets 4 times a day, also add on Seroquel 25mg  qhs.  She was [facility] for U[facility], followed by rehab for 3 weeks, had severe dehydration, there was confuse of her medication during the process, seroquel was stopped, she was  noted to have increased neck, she finally back to [place], now back to seroquel 25mg  qhs started on trazodone 50 mg 1/2 tablet every night since May 20, 2020,  She is again  treated with macrobid since June 19 2020 for recurrent UTI, complains of generalized fatigue, blood pressure is on the low normal range 96/60  Update October 27, 2020 SS: Here today with her daughter, resides at [facility], remains overall stable.  Taking Sinemet 25/10 mg 1.5 tablets 4 times a day, Seroquel 50 mg at bedtime.  Gait is doing well, no falls, uses walker, walks to meal hall.  Sleeping well, occasional hallucination, much less frequent.  Requires assistance with ADLs, makes a mess feeding herself, uses weighted utensils.  Does PT twice a week. Mood is good, very sweet, cooperative. No tremor.  Has trouble finding her words, can be embarrassing.   REVIEW OF SYSTEMS: Out of a complete 14 system review of symptoms, the patient complains only of the following symptoms, and all other reviewed systems are negative.  Walking difficulty  ALLERGIES: Allergies  Allergen Reactions  . Benztropine     Other reaction(s): Hallucinations Stomach cramps Stomach cramps   . Escitalopram     Hallucinations    HOME MEDICATIONS: Outpatient Medications Prior to Visit  Medication Sig Dispense Refill  . acetaminophen (TYLENOL) 500 MG tablet Take 500 mg by mouth every 6 (six) hours as needed.    [place] atorvastatin (LIPITOR) 40 MG tablet Take 40 mg by mouth daily.    . carbidopa-levodopa (SINEMET IR) 25-100 MG tablet Take 1.5 tablets by mouth 4 (four) times daily. 540 tablet 4  . Cholecalciferol (VITAMIN D3) 2000 units TABS Take 2,000 Units by mouth daily.    . citalopram (CELEXA) 10 MG tablet Take 1 tablet (10 mg total) by mouth daily. 90 tablet 4  . Cyanocobalamin (VITAMIN B-12 IJ) Inject 1,000 Units as directed every 30 (thirty) days.    [place] diltiazem (CARDIZEM LA) 180 MG 24 hr tablet Take 180 mg by mouth daily.    Marland Kitchen docusate sodium (COLACE) 100 MG capsule Take 100 mg by mouth daily as needed for mild constipation.    . furosemide (LASIX) 20 MG tablet Take 1 tablet by mouth every 24 hours as needed  for edema. Take 20 mg for 3 days if 3 lb weight gain and take 20 mg for 7 days for 5 lb weight gain.    Marland Kitchen gabapentin (NEURONTIN) 300 MG capsule Take 1 capsule (300 mg total) by mouth at bedtime. 90 capsule 4  . levothyroxine (SYNTHROID, LEVOTHROID) 88 MCG tablet Take 88 mcg by mouth daily before breakfast.     . magnesium oxide (MAG-OX) 400 MG tablet Take 400 mg by mouth daily.    . Melatonin 3 MG TABS Take 3 mg by mouth at bedtime.    Marland Kitchen omeprazole (PRILOSEC) 40 MG capsule Take 40 mg by mouth daily.    . Polyethylene Glycol 3350-GRX POWD Take by mouth as needed.    Marland Kitchen POLYSACCHARIDE IRON COMPLEX PO 150-0.025-1mg  - one capsule twice daily    . QUEtiapine (SEROQUEL) 25 MG tablet Take 0.5 tablets (12.5 mg total) by mouth at bedtime. (Patient taking differently: Take 50 mg by mouth at bedtime. ) 30 tablet 3  . rivaroxaban (XARELTO) 20 MG TABS tablet Take 20 mg by mouth daily with supper.    . nitrofurantoin, macrocrystal-monohydrate, (MACROBID) 100 MG capsule Take 100 mg by mouth 2 (two) times daily.    05-25-1987 telmisartan (MICARDIS) 40  MG tablet Take 40 mg by mouth daily.     No facility-administered medications prior to visit.    PAST MEDICAL HISTORY: Past Medical History:  Diagnosis Date  . Anemia   . Anxiety   . Atrial fibrillation (HCC)   . Cataract   . Diabetes mellitus without complication (HCC)   . Hyperlipidemia   . Hypertension   . Parkinson disease (HCC)   . Thyroid disorder     PAST SURGICAL HISTORY: Past Surgical History:  Procedure Laterality Date  . APPENDECTOMY    . CESAREAN SECTION    . FEMUR FRACTURE SURGERY Right   . KNEE SURGERY Bilateral   . THYROIDECTOMY, PARTIAL    . vein sugrery      FAMILY HISTORY: Family History  Problem Relation Age of Onset  . Breast cancer Mother   . Heart disease Father     SOCIAL HISTORY: Social History   Socioeconomic History  . Marital status: Widowed    Spouse name: Not on file  . Number of children: 4  . Years of  education: 4112  . Highest education level: High school graduate  Occupational History  . Occupation: Retired  Tobacco Use  . Smoking status: Never Smoker  . Smokeless tobacco: Never Used  . Tobacco comment: NEVER SMOKED  Vaping Use  . Vaping Use: Never used  Substance and Sexual Activity  . Alcohol use: Never    Alcohol/week: 0.0 standard drinks  . Drug use: Never  . Sexual activity: Never  Other Topics Concern  . Not on file  Social History Narrative   Right-handed.   [facility]    Decaf coffee & drinks some diet pepsi   Social Determinants of Health   Financial Resource Strain:   . Difficulty of Paying Living Expenses: Not on file  Food Insecurity:   . Worried About Programme researcher, broadcasting/film/videounning Out of Food in the Last Year: Not on file  . Ran Out of Food in the Last Year: Not on file  Transportation Needs:   . Lack of Transportation (Medical): Not on file  . Lack of Transportation (Non-Medical): Not on file  Physical Activity:   . Days of Exercise per Week: Not on file  . Minutes of Exercise per Session: Not on file  Stress:   . Feeling of Stress : Not on file  Social Connections:   . Frequency of Communication with Friends and Family: Not on file  . Frequency of Social Gatherings with Friends and Family: Not on file  . Attends Religious Services: Not on file  . Active Member of Clubs or Organizations: Not on file  . Attends BankerClub or Organization Meetings: Not on file  . Marital Status: Not on file  Intimate Partner Violence:   . Fear of Current or Ex-Partner: Not on file  . Emotionally Abused: Not on file  . Physically Abused: Not on file  . Sexually Abused: Not on file   PHYSICAL EXAM  Vitals:   10/27/20 1339  BP: 118/72  Pulse: 74  Weight: 165 lb (74.8 kg)  Height: 5\' 7"  (1.702 m)   Body mass index is 25.84 kg/m.  Generalized: Well developed, in no acute distress   Neurological examination  Mentation: Alert, cooperative ,  most history is provided by her daughter, mild trouble with word finding Cranial nerve II-XII: Pupils were equal round reactive to light. Extraocular movements were full, visual field were full on confrontational test. Facial sensation and strength were normal.  Head turning and shoulder shrug  were normal and symmetric. Motor: Strength is intact to all extremities, mild bradykinesia with commands Sensory: Sensory testing is intact to soft touch on all 4 extremities. No evidence of extinction is noted.  Coordination: Cerebellar testing reveals good finger-nose-finger and heel-to-shin bilaterally.  Gait and station: Slow to rise from seated position, has to push off, gait is wide-based, cautious with walker  DIAGNOSTIC DATA (LABS, IMAGING, TESTING) - I reviewed patient records, labs, notes, testing and imaging myself where available.  Lab Results  Component Value Date   WBC 4.6 10/31/2018   HGB 12.3 10/31/2018   HCT 38.5 10/31/2018   MCV 98.2 10/31/2018   PLT 118 (L) 10/31/2018      Component Value Date/Time   NA 137 10/31/2018 1425   K 4.2 10/31/2018 1425   CL 101 10/31/2018 1425   CO2 27 10/31/2018 1425   GLUCOSE 124 (H) 10/31/2018 1425   BUN 12 10/31/2018 1425   CREATININE 0.83 10/31/2018 1425   CALCIUM 9.3 10/31/2018 1425   GFRNONAA >60 10/31/2018 1425   GFRAA >60 10/31/2018 1425   No results found for: CHOL, HDL, LDLCALC, LDLDIRECT, TRIG, CHOLHDL No results found for: KCLE7N No results found for: VITAMINB12 No results found for: TSH  ASSESSMENT AND PLAN 79 y.o. year old female  has a past medical history of Anemia, Anxiety, Atrial fibrillation (HCC), Cataract, Diabetes mellitus without complication (HCC), Hyperlipidemia, Hypertension, Parkinson disease (HCC), and Thyroid disorder. here with:  1.  Parkinson's disease 2.  Memory loss with confusion 3.  Gait abnormality -Seems overall stable, walking with walker fairly well  -Continue Sinemet 25/100 mg, 1.5 tablets 4 times  daily (7, 11, 2 pm ,5 pm) -Continue Seroquel 50 mg at bedtime (sleeping better, less hallucinations) -Encouraged to continue activity, exercise, PT -Follow-up in 6 months or sooner if needed  I spent 30 minutes of face-to-face and non-face-to-face time with patient.  This included previsit chart review, lab review, study review, order entry, electronic health record documentation, patient education.  Margie Ege, AGNP-C, DNP 10/27/2020, 2:30 PM [facility] [address], [address] [place], [place] [address] [phone]

## 2020-10-27 NOTE — Patient Instructions (Signed)
Continue current medications Encouraged activity, socialization, PT, exercise as allowed See you back in 6 months

## 2020-11-03 ENCOUNTER — Telehealth: Payer: Self-pay | Admitting: Neurology

## 2020-11-03 NOTE — Telephone Encounter (Signed)
I called Claudia Finley at [facility] clarified the dose of seroquel.  Pt is taking 50mg  po qhs. (this of 2 25mg  tablets).  She will make sure this is corrected at the pharmacy.  That is what they have been giving her she stated.

## 2020-11-03 NOTE — Telephone Encounter (Signed)
Debbe Odea with [facility] called stating they are confused about the seroquel medication. It says 12.5 mg and now it says to increase to 12.5 mg. She doesn't know if the 2 tablets will equal the 12.5 mg or does it equal 50 mg. She just needs to clarification on how to give it to the patient. Her phone number to call back is [phone].

## 2021-03-29 ENCOUNTER — Telehealth: Payer: Self-pay | Admitting: Neurology

## 2021-03-29 NOTE — Telephone Encounter (Signed)
Pt's daughter(on DPR) has asked pt be seen earlier than 06-01 due to pt having recent falls and becoming more agitated, daughter told if RN has questions or concerns she will be called

## 2021-03-29 NOTE — Telephone Encounter (Addendum)
We see pt for PD, and also she is prescribed seroquel for agiitation.  Has appt 03-30-2021

## 2021-03-30 ENCOUNTER — Ambulatory Visit: Payer: Medicare Other | Admitting: Neurology

## 2021-03-30 ENCOUNTER — Encounter: Payer: Self-pay | Admitting: Neurology

## 2021-03-30 VITALS — BP 111/69 | HR 50

## 2021-03-30 DIAGNOSIS — G20 Parkinson's disease: Secondary | ICD-10-CM

## 2021-03-30 DIAGNOSIS — F0391 Unspecified dementia with behavioral disturbance: Secondary | ICD-10-CM

## 2021-03-30 NOTE — Progress Notes (Signed)
PATIENT: Claudia Finley DOB: Nov 17, 1941  REASON FOR VISIT: follow up HISTORY FROM: patient  HISTORY OF PRESENT ILLNESS: Today 03/30/21  HISTORY  Claudia T Ratliffis a 80 year old female, seen in refer by her primary care doctor [name] to follow-up for Parkinson's disease, she is accompanied by her daughter Claudia Finley at today's visit, initial evaluation was on March 13, 2018.  She has past medical history of hypertension,  hyperlipidemia,  chronic atrial fibrillation, on chronic Xarelto treatment, denied previous history of coronary artery disease or stroke, somehow she is also on aspirin 81 mg daily, complains of frequent bruise, also recent worsening bilateral lower extremity edema.   She was seen by Dr. [name] since 2015 because of gradual onset bilateral hands tremor, diffuse weakness, gait abnormality, decreased facial expression per record, micrographia, she was diagnosed with Parkinson's disease, initially was treated with Requip 0.25 mg gradually titrating up to current 2 mg 3 times a day,   Sinemet was introduced around 2017, now taking 1 tablet 4 tabs daily She fell, suffered a right femur fracture in 2017, has relied on her walker since.   She moved to assistant living [place] since December 2018, with her husband, unfortunately, her husband passed away in 02-08-2019she went through a lot of changes recently,  She now complains of diffuse body achy pain, increased right shoulder pain, had a history of right rotator cuff syndrome, she also complains of diffuse joint pain, especially right knee, slow moving, she can still ambulate with her walker to the dining hall, along the hallway, dressing, but has worsening urinary incontinence, wearing depends, require help with bathing. She was also noted to have slow worsening memory loss,   MRI was normal in February 2015,  Echocardiogram in March 2019 showed normal left ventricular site systolic function was  no appreciable segmental abnormality, ejection fraction was 60%, moderate tricuspid regurgitation.  UPDATE Dec 17 2018: Requip was stopped in April 2019 for increased bilateral lower extremity swelling, she reported worsening restless leg symptoms, failed to improve restarting Requip at 4 mg at nighttime, throughout the day her legs were jumpy, worsened at nighttime, difficulty sleeping, also complains of increased anxiety,  She is with her daughter Claudia Finley and Claudia Finley at today's visit, sleeps well at nighttime, Mini-Mental status 28/30. She has words finding difficulty, mild memory loss, continue complains of evening and nighttime restless leg symptoms, leg jumping, difficulty holding still  UPDATE Sept 23 2020: She is accompanied by her daughter Claudia Finley at today's clinical visit, last visit was through telephone in April 2020, she lives in [facility], since [place] in March 2020, she was not able to see her family regularly, noted increased confusion, anxiety, she is taking Sinemet 25/100 mg 4 times a day, which is helpful, gabapentin 300 mg twice daily because increased daytime sleepiness, she is doing better since decreased to 300 mg every night,   UPDATE June 24 2020: In December 2020, she went to the hospital was admitted for lethargy, abdominal pain, was found to be constipated, had 2 episodes of vomiting, improved after bowel movement.  Family reported increased confusion at last visit in March 2021, Sinemet dose was increased to 25/100 mg from 1-1 and half tablets 4 times a day, also add on Seroquel 25mg  qhs.  She was [facility] for UTI, followed by rehab for 3 weeks, had severe dehydration, there was confuse of her medication during the process, seroquel was stopped, she was  noted to have increased neck, she finally back to Claudia Finley, now back to seroquel 25mg  qhs started on trazodone 50 mg 1/2 tablet every night since May 20, 2020,  She is again  treated with macrobid since June 19 2020 for recurrent UTI, complains of generalized fatigue, blood pressure is on the low normal range 96/60  Update October 27, 2020 SS: Here today with her daughter, resides at [facility], remains overall stable.  Taking Sinemet 25/10 mg 1.5 tablets 4 times a day, Seroquel 50 mg at bedtime.  Gait is doing well, no falls, uses walker, walks to meal hall.  Sleeping well, occasional hallucination, much less frequent.  Requires assistance with ADLs, makes a mess feeding herself, uses weighted utensils.  Does PT twice a week. Mood is good, very sweet, cooperative. No tremor.  Has trouble finding her words, can be embarrassing.   Update Mar 30, 2021 SS: Here with daughter, Claudia Lofterry. Total of 3 falls, coming back from bathroom, or back from dining hall. Feel room starts spinning, then falls backward. Claudia Finley to back, buttocks is bruised. Fall on 4/23, 4/26. Feeling more anxious, wants to have a tissue at all times. On Seroquel 50 mg at bedtime, Celexa 10 mg daily. Daughter just asked to restart PT. No BM in 3 days, daughter concerned for UTI, is chewing her medications, is a sign of UTI historically. Doing ST to help find words.  Generally sleeping well. Went out yesterday with her daughter, using her walker, got her nails done, eye brows waxed, had a great day. Today not such a good day, had to get up early, tired from yesterday.   REVIEW OF SYSTEMS: Out of a complete 14 system review of symptoms, the patient complains only of the following symptoms, and all other reviewed systems are negative.  Walking difficulty  ALLERGIES: Allergies  Allergen Reactions  . Benztropine     Other reaction(s): Hallucinations Stomach cramps Stomach cramps   . Escitalopram     Hallucinations    HOME MEDICATIONS: Outpatient Medications Prior to Visit  Medication Sig Dispense Refill  . acetaminophen (TYLENOL) 500 MG tablet Take 500 mg by mouth every 6 (six) hours as needed.    Marland Kitchen.  atorvastatin (LIPITOR) 40 MG tablet Take 40 mg by mouth daily.    . carbidopa-levodopa (SINEMET IR) 25-100 MG tablet Take 1.5 tablets by mouth 4 (four) times daily. 540 tablet 4  . Cholecalciferol (VITAMIN D3) 2000 units TABS Take 2,000 Units by mouth daily.    . citalopram (CELEXA) 10 MG tablet Take 1 tablet (10 mg total) by mouth daily. 90 tablet 4  . Cyanocobalamin (VITAMIN B-12 IJ) Inject 1,000 Units as directed every 30 (thirty) days.    Marland Kitchen. diltiazem (CARDIZEM LA) 180 MG 24 hr tablet Take 180 mg by mouth daily.    Marland Kitchen. docusate sodium (COLACE) 100 MG capsule Take 100 mg by mouth daily as needed for mild constipation.    . furosemide (LASIX) 20 MG tablet Take 1 tablet by mouth every 24 hours as needed for edema. Take 20 mg for 3 days if 3 lb weight gain and take 20 mg for 7 days for 5 lb weight gain.    Marland Kitchen. gabapentin (NEURONTIN) 300 MG capsule Take 1 capsule (300 mg total) by mouth at bedtime. 90 capsule 4  . levothyroxine (SYNTHROID, LEVOTHROID) 88 MCG tablet Take 88 mcg by mouth daily before breakfast.     . magnesium oxide (MAG-OX) 400 MG tablet Take 400 mg by mouth daily.    .Marland Kitchen  Melatonin 3 MG TABS Take 3 mg by mouth at bedtime.    Marland Kitchen omeprazole (PRILOSEC) 40 MG capsule Take 40 mg by mouth daily.    . Polyethylene Glycol 3350-GRX POWD Take by mouth as needed.    Marland Kitchen POLYSACCHARIDE IRON COMPLEX PO 150-0.025-1mg  - one capsule twice daily    . QUEtiapine (SEROQUEL) 25 MG tablet Take 0.5 tablets (12.5 mg total) by mouth at bedtime. (Patient taking differently: Take 50 mg by mouth at bedtime.) 30 tablet 3  . rivaroxaban (XARELTO) 20 MG TABS tablet Take 20 mg by mouth daily with supper.     No facility-administered medications prior to visit.    PAST MEDICAL HISTORY: Past Medical History:  Diagnosis Date  . Anemia   . Anxiety   . Atrial fibrillation (HCC)   . Cataract   . Diabetes mellitus without complication (HCC)   . Hyperlipidemia   . Hypertension   . Parkinson disease (HCC)   . Thyroid  disorder     PAST SURGICAL HISTORY: Past Surgical History:  Procedure Laterality Date  . APPENDECTOMY    . CESAREAN SECTION    . FEMUR FRACTURE SURGERY Right   . KNEE SURGERY Bilateral   . THYROIDECTOMY, PARTIAL    . vein sugrery      FAMILY HISTORY: Family History  Problem Relation Age of Onset  . Breast cancer Mother   . Heart disease Father     SOCIAL HISTORY: Social History   Socioeconomic History  . Marital status: Widowed    Spouse name: Not on file  . Number of children: 4  . Years of education: 11  . Highest education level: High school graduate  Occupational History  . Occupation: Retired  Tobacco Use  . Smoking status: Never Smoker  . Smokeless tobacco: Never Used  . Tobacco comment: NEVER SMOKED  Vaping Use  . Vaping Use: Never used  Substance and Sexual Activity  . Alcohol use: Never    Alcohol/week: 0.0 standard drinks  . Drug use: Never  . Sexual activity: Never  Other Topics Concern  . Not on file  Social History Narrative   Right-handed.   Lives at [facility]    Decaf coffee & drinks some diet pepsi   Social Determinants of Health   Financial Resource Strain: Not on file  Food Insecurity: Not on file  Transportation Needs: Not on file  Physical Activity: Not on file  Stress: Not on file  Social Connections: Not on file  Intimate Partner Violence: Not on file   PHYSICAL EXAM  Vitals:   03/30/21 0944  BP: 111/69  Pulse: (!) 50   There is no height or weight on file to calculate BMI.  Generalized: Well developed, in no acute distress   Neurological examination  Mentation: Alert, cooperative , most history is provided by her daughter, mild trouble with word finding, slow to respond, masking of face seen Cranial nerve II-XII: Pupils were equal round reactive to light. Extraocular movements were full, visual field were full on confrontational test. Facial sensation and strength were normal. Head  turning and shoulder shrug  were normal and symmetric. Motor: Strength is intact to all extremities, mild bradykinesia with commands Sensory: Sensory testing is intact to soft touch on all 4 extremities. No evidence of extinction is noted.  Coordination: some apraxia with commands Gait and station: Slow to rise from seated position, needs assistance, wouldn't straighten out to take steps, without walker today, couldn't stand upright long  enough for BP standing  DIAGNOSTIC DATA (LABS, IMAGING, TESTING) - I reviewed patient records, labs, notes, testing and imaging myself where available.  Lab Results  Component Value Date   WBC 4.6 10/31/2018   HGB 12.3 10/31/2018   HCT 38.5 10/31/2018   MCV 98.2 10/31/2018   PLT 118 (L) 10/31/2018      Component Value Date/Time   NA 137 10/31/2018 1425   K 4.2 10/31/2018 1425   CL 101 10/31/2018 1425   CO2 27 10/31/2018 1425   GLUCOSE 124 (H) 10/31/2018 1425   BUN 12 10/31/2018 1425   CREATININE 0.83 10/31/2018 1425   CALCIUM 9.3 10/31/2018 1425   GFRNONAA >60 10/31/2018 1425   GFRAA >60 10/31/2018 1425   No results found for: CHOL, HDL, LDLCALC, LDLDIRECT, TRIG, CHOLHDL No results found for: BEEF0O No results found for: VITAMINB12 No results found for: TSH  ASSESSMENT AND PLAN 80 y.o. year old female  has a past medical history of Anemia, Anxiety, Atrial fibrillation (HCC), Cataract, Diabetes mellitus without complication (HCC), Hyperlipidemia, Hypertension, Parkinson disease (HCC), and Thyroid disorder. here with:  1.  Parkinson's disease 2.  Memory loss with confusion 3.  Gait abnormality  -After discussion with her daughter, decided to keep medications as is Seroquel 50 mg at bedtime, Celexa 10 mg daily, some anxiety, but is not particularly bothersome, could try Celexa 15 mg daily in future if needed  -Wrote for facility to restart PT, have primary care doctor evaluate tomorrow, consider urinalysis (daughter has noted her chewing her  medications, can be a sign), consider routine labs, evaluate buttocks post-fall, portable imaging, but was able to go out all day yesterday on her walker, was a great day, 3 days without BM; mental status is basically at baseline however   -Needs to stand slowly, get bearings before initiating gait, also have assistance with ambulation and toileting; ensure drinking plenty of water to help potential BP drops  -Recommend discussing with cardiology, about long-term plans for Xarelto given frequent falls, on for AFIB  -Continue Sinemet 25/100 mg, 1.5 tablets 4 times daily (7, 11, 2 pm ,5 pm)  -CT head, cervical spine show no acute injuries from fall 03/20/21  -Call for worsening symptoms, update of status  -Follow-up in 6 months or sooner if needed with Dr. Armandina Gemma, AGNP-C, DNP 03/30/2021, 11:15 AM [facility] [address] [place], [place] [address] [phone]

## 2021-03-30 NOTE — Patient Instructions (Signed)
Continue current medications Follow-up with cardiology about Xarelto Be careful when standing, stand slowly, drink plenty of water  Have primary care provider check on her tomorrow Get started in PT See her back in 6 months

## 2021-04-28 ENCOUNTER — Ambulatory Visit: Payer: Medicare HMO | Admitting: Neurology

## 2021-07-28 ENCOUNTER — Encounter (HOSPITAL_BASED_OUTPATIENT_CLINIC_OR_DEPARTMENT_OTHER): Payer: Self-pay | Admitting: *Deleted

## 2021-07-28 ENCOUNTER — Emergency Department (HOSPITAL_BASED_OUTPATIENT_CLINIC_OR_DEPARTMENT_OTHER)
Admission: EM | Admit: 2021-07-28 | Discharge: 2021-07-28 | Disposition: A | Discharge status: Home / Self Care | Payer: Medicare Other | Attending: Emergency Medicine | Admitting: Emergency Medicine

## 2021-07-28 ENCOUNTER — Other Ambulatory Visit: Payer: Self-pay

## 2021-07-28 DIAGNOSIS — E1142 Type 2 diabetes mellitus with diabetic polyneuropathy: Secondary | ICD-10-CM | POA: Insufficient documentation

## 2021-07-28 DIAGNOSIS — W231XXA Caught, crushed, jammed, or pinched between stationary objects, initial encounter: Secondary | ICD-10-CM | POA: Insufficient documentation

## 2021-07-28 DIAGNOSIS — S8992XA Unspecified injury of left lower leg, initial encounter: Secondary | ICD-10-CM | POA: Diagnosis present

## 2021-07-28 DIAGNOSIS — S81812A Laceration without foreign body, left lower leg, initial encounter: Secondary | ICD-10-CM | POA: Insufficient documentation

## 2021-07-28 DIAGNOSIS — E785 Hyperlipidemia, unspecified: Secondary | ICD-10-CM | POA: Insufficient documentation

## 2021-07-28 DIAGNOSIS — F0281 Dementia in other diseases classified elsewhere with behavioral disturbance: Secondary | ICD-10-CM | POA: Diagnosis not present

## 2021-07-28 DIAGNOSIS — E1136 Type 2 diabetes mellitus with diabetic cataract: Secondary | ICD-10-CM | POA: Insufficient documentation

## 2021-07-28 DIAGNOSIS — G20 Parkinson's disease: Secondary | ICD-10-CM | POA: Diagnosis not present

## 2021-07-28 DIAGNOSIS — Z7901 Long term (current) use of anticoagulants: Secondary | ICD-10-CM | POA: Insufficient documentation

## 2021-07-28 DIAGNOSIS — Z79899 Other long term (current) drug therapy: Secondary | ICD-10-CM | POA: Diagnosis not present

## 2021-07-28 DIAGNOSIS — E1169 Type 2 diabetes mellitus with other specified complication: Secondary | ICD-10-CM | POA: Diagnosis not present

## 2021-07-28 NOTE — ED Triage Notes (Signed)
Per EMS: pt from [facility]. States that her ankle was caught in her wheelchair.  Skin tear to left ankle.  Bleeding controlled.  VSS

## 2021-07-28 NOTE — ED Notes (Signed)
Steri strips applied. Wound dressed with non stick pad, wrapped with cling gauze.

## 2021-07-28 NOTE — Discharge Instructions (Addendum)
Keep the area clean and dry.  Do not get wet for 24 hours.  After that can let water run over it but do not scrub. Return for redness, drainage, fever.

## 2021-07-28 NOTE — ED Provider Notes (Signed)
[facility] EMERGENCY DEPARTMENT Provider Note   CSN: [id number] Arrival date & time: 07/28/21  2146     History Chief Complaint  Patient presents with   Ankle Injury    Claudia Finley is a 80 y.o. female.  80 yo F with a chief complaints of wound to the left lower leg.  The patient has progressive dementia and she has had some difficulty getting up and ambulating.  She has been trying to slide out of her chair recently.  It seems that may have occurred earlier and she was found with her leg stuck in her wheelchair.  Level 5 caveat dementia.   Ankle Injury      Past Medical History:  Diagnosis Date   Anemia    Anxiety    Atrial fibrillation (HCC)    Cataract    Diabetes mellitus without complication (HCC)    Hyperlipidemia    Hypertension    Parkinson disease (HCC)    Thyroid disorder     Patient Active Problem List   Diagnosis Date Noted   Dementia with behavioral disturbance (HCC) 06/24/2020   Hallucinations 02/11/2020   Confusion 03/01/2019   Restless leg syndrome 12/17/2018   Gait abnormality 03/13/2018   Parkinsonism (HCC) 03/13/2018   Parkinson's disease (HCC) 06/22/2015   Colon polyp 04/07/2015   Decreased leukocytes 03/23/2015   HLD (hyperlipidemia) 03/20/2015   Anemia 01/12/2015   Change in blood platelet count 12/24/2014   Adult hypothyroidism 12/17/2014   Depression, major, in remission (HCC) 12/17/2014   Movement disorder 12/17/2014   Benign essential HTN 08/04/2014   DDD (degenerative disc disease), lumbar 08/04/2014   Degenerative arthritis of hip 08/04/2014   Diabetes mellitus, type 2 (HCC) 08/04/2014   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     Family History   Problem Relation Age of Onset   Breast cancer Mother    Heart disease Father     Social History   Tobacco Use   Smoking status: Never   Smokeless tobacco: Never   Tobacco comments:    NEVER SMOKED  Vaping Use   Vaping Use: Never used  Substance Use Topics   Alcohol use: Never    Alcohol/week: 0.0 standard drinks   Drug use: Never    Home Medications Prior to Admission medications   Medication Sig Start Date End Date Taking? Authorizing Provider  acetaminophen (TYLENOL) 500 MG tablet Take 500 mg by mouth every 6 (six) hours as needed.    [provider]  atorvastatin (LIPITOR) 40 MG tablet Take 40 mg by mouth daily.    [provider]  carbidopa-levodopa (SINEMET IR) 25-100 MG tablet Take 1.5 tablets by mouth 4 (four) times daily. 08/21/19   Levert Feinstein, MD  Cholecalciferol (VITAMIN D3) 2000 units TABS Take 2,000 Units by mouth daily.    [provider]  citalopram (CELEXA) 10 MG tablet Take 1 tablet (10 mg total) by mouth daily. 08/21/19   Levert Feinstein, MD  Cyanocobalamin (VITAMIN B-12 IJ) Inject 1,000 Units as directed every 30 (thirty) days.    [provider]  diltiazem (CARDIZEM LA) 180 MG 24 hr tablet Take 180 mg  by mouth daily.    [provider]  docusate sodium (COLACE) 100 MG capsule Take 100 mg by mouth daily as needed for mild constipation.    [provider]  furosemide (LASIX) 20 MG tablet Take 1 tablet by mouth every 24 hours as needed for edema. Take 20 mg for 3 days if 3 lb weight gain and take 20 mg for 7 days for 5 lb weight gain.    [provider]  gabapentin (NEURONTIN) 300 MG capsule Take 1 capsule (300 mg total) by mouth at bedtime. 08/21/19   Levert Feinstein, MD  levothyroxine (SYNTHROID, LEVOTHROID) 88 MCG tablet Take 88 mcg by mouth daily before breakfast.     [provider]  magnesium oxide (MAG-OX) 400 MG tablet Take 400 mg by mouth daily.    [provider]  Melatonin 3 MG TABS  Take 3 mg by mouth at bedtime.    [provider]  omeprazole (PRILOSEC) 40 MG capsule Take 40 mg by mouth daily.    [provider]  Polyethylene Glycol 3350-GRX POWD Take by mouth as needed.    [provider]  POLYSACCHARIDE IRON COMPLEX PO 150-0.025-1mg  - one capsule twice daily    [provider]  QUEtiapine (SEROQUEL) 25 MG tablet Take 0.5 tablets (12.5 mg total) by mouth at bedtime. Patient taking differently: Take 50 mg by mouth at bedtime. 02/12/20   Glean Salvo, NP  rivaroxaban (XARELTO) 20 MG TABS tablet Take 20 mg by mouth daily with supper.    [provider]    Allergies    Benztropine and Escitalopram  Review of Systems   Review of Systems  Unable to perform ROS: Dementia  Skin:  Positive for wound.   Physical Exam Updated Vital Signs Ht 5\' 7"  (1.702 m)   Wt 74.8 kg   BMI 25.83 kg/m   Physical Exam Vitals and nursing note reviewed.  Constitutional:      General: She is not in acute distress.    Appearance: She is well-developed. She is not diaphoretic.  HENT:     Head: Normocephalic and atraumatic.  Eyes:     Pupils: Pupils are equal, round, and reactive to light.  Cardiovascular:     Rate and Rhythm: Normal rate and regular rhythm.     Heart sounds: No murmur heard.   No friction rub. No gallop.  Pulmonary:     Effort: Pulmonary effort is normal.     Breath sounds: No wheezing or rales.  Abdominal:     General: There is no distension.     Palpations: Abdomen is soft.     Tenderness: There is no abdominal tenderness.  Musculoskeletal:        General: No tenderness.     Cervical back: Normal range of motion and neck supple.  Skin:    General: Skin is warm and dry.     Comments: Skin tear to the medial aspect of the left lower leg.  No bony tenderness.  Small area of skin avulsion.  Patient also has a older appearing skin tear to the left lateral elbow.  No erythema no drainage no fluctuance.  Neurological:      Mental Status: She is alert and oriented to person, place, and time.  Psychiatric:        Behavior: Behavior normal.    ED Results / Procedures / Treatments   Labs (all labs ordered are listed, but only abnormal results are displayed) Labs Reviewed - No  data to display  EKG None  Radiology No results found.  Procedures Procedures   Medications Ordered in ED Medications - No data to display  ED Course  I have reviewed the triage vital signs and the nursing notes.  Pertinent labs & imaging results that were available during my care of the patient were reviewed by me and considered in my medical decision making (see chart for details).    MDM Rules/Calculators/A&P                           80 yo F with a cc of a wound to the LLE.  Skin tear, doubt fx. will approximate wound with Steri-Strips as best we can.  PCP follow-up.  10:36 PM:  I have discussed the diagnosis/risks/treatment options with the patient and family and believe the pt to be eligible for discharge home to follow-up with PCP. We also discussed returning to the ED immediately if new or worsening sx occur. We discussed the sx which are most concerning (e.g., sudden worsening pain, fever, inability to tolerate by mouth, redness, drainage) that necessitate immediate return. Medications administered to the patient during their visit and any new prescriptions provided to the patient are listed below.  Medications given during this visit Medications - No data to display   The patient appears reasonably screen and/or stabilized for discharge and I doubt any other medical condition or other [facility] requiring further screening, evaluation, or treatment in the ED at this time prior to discharge.   Final Clinical Impression(s) / ED Diagnoses Final diagnoses:  Skin tear of left lower leg without complication, initial encounter    Rx / DC Orders ED Discharge Orders     None        Melene Plan, DO 07/28/21  2236

## 2021-08-17 ENCOUNTER — Telehealth: Payer: Self-pay | Admitting: Neurology

## 2021-08-17 NOTE — Telephone Encounter (Signed)
Pt's daughter called stating her mother is worsening, she is falling more frequently and her anxiety has increased. Pt's daughter requesting a call back.

## 2021-08-18 NOTE — Telephone Encounter (Signed)
I spoke to the patient's daughter. States her mother's overall health has declined since last seen. She is more anxious, processing thoughts slower, frustrated by word finding difficulty, memory issues. She is scheduled to be seen in November but her daughter feels she needs an earlier evaluation. We have moved her to 08/19/21 (check-in 9:30 for 10am appt). She needs help transporting her mother and is going to ask her brother for help. She will only call back if the time does not work. We left the 10/07/21 appt pending just in case. If they come tomorrow, she will cancel the other follow up.

## 2021-08-19 ENCOUNTER — Ambulatory Visit: Payer: Medicare Other | Admitting: Neurology

## 2021-08-19 VITALS — BP 101/69 | HR 71

## 2021-08-19 DIAGNOSIS — G20 Parkinson's disease: Secondary | ICD-10-CM

## 2021-08-19 DIAGNOSIS — F0391 Unspecified dementia with behavioral disturbance: Secondary | ICD-10-CM

## 2021-08-19 DIAGNOSIS — R269 Unspecified abnormalities of gait and mobility: Secondary | ICD-10-CM

## 2021-08-19 NOTE — Progress Notes (Signed)
Chief Complaint  Patient presents with   Follow-up    Rm 16, with daughter, more anxious, processing thoughts slower, frustrated by word finding difficulty, memory issues. Uses walker and wheelchair       ASSESSMENT AND PLAN  Claudia Finley is a 80 y.o. female     Parkinson's disease Worsening memory loss Gait abnormality  Overall stable with current dose of Sinemet 25/100 mg 1 and half tablet 4 times a day, no significant side effect noted  Sleeping well with Seroquel 25 mg 2 tablets every night,  Continue physical therapy  Return to clinic in 6 months  DIAGNOSTIC DATA (LABS, IMAGING, TESTING) - I reviewed patient records, labs, notes, testing and imaging myself where available.  CT head and cervical in Dec 2019. No acute intracranial abnormality.   No evidence of acute traumatic injury to the cervical spine.   Multilevel osteoarthritic changes of the cervical spine with mild anterolisthesis of C4 on C5 and posterior listhesis of C5 on C6.  MEDICAL HISTORY:  Claudia Finley is a 80 year old female, seen in refer by her primary care doctor Derrell Lolling, [name] to follow-up for Parkinson's disease, she is accompanied by her daughter [name] at today's visit, initial evaluation was on March 13, 2018.   She has past medical history of hypertension,  hyperlipidemia,  chronic atrial fibrillation, on chronic Xarelto treatment, denied previous history of coronary artery disease or stroke, somehow she is also on aspirin 81 mg daily, complains of frequent bruise, also recent worsening bilateral lower extremity edema.    She was seen by Dr. [name] since 2015 because of gradual onset bilateral hands tremor, diffuse weakness, gait abnormality, decreased facial expression per record, micrographia, she was diagnosed with Parkinson's disease, initially was treated with Requip 0.25 mg gradually titrating up to current 2 mg 3 times a day,    Sinemet was introduced around 2017, now  taking 1 tablet 4 tabs daily She fell, suffered a right femur fracture in 2017, has relied on her walker since.     She moved to assistant living [place] since December 2018, with her husband, unfortunately, her husband passed away in 08-Feb-2019she went through a lot of changes recently,   She now complains of diffuse body achy pain, increased right shoulder pain, had a history of right rotator cuff syndrome, she also complains of diffuse joint pain, especially right knee, slow moving, she can still ambulate with her walker to the dining hall, along the hallway, dressing, but has worsening urinary incontinence, wearing depends, require help with bathing. She was also noted to have slow worsening memory loss,    MRI was normal in February 2015,  Echocardiogram in March 2019 showed normal left ventricular site systolic function was no appreciable segmental abnormality, ejection fraction was 60%, moderate tricuspid regurgitation.   UPDATE Dec 17 2018: Requip was stopped in April 2019 for increased bilateral lower extremity swelling, she reported worsening restless leg symptoms, failed to improve restarting Requip at 4 mg at nighttime, throughout the day her legs were jumpy, worsened at nighttime, difficulty sleeping, also complains of increased anxiety,   She is with her daughter [name] and [name] at today's visit, sleeps well at nighttime, Mini-Mental status 28/30.  She has words finding difficulty, mild memory loss, continue complains of evening and nighttime restless leg symptoms, leg jumping, difficulty holding still   UPDATE Sept 23 2020: She is accompanied by her daughter [name] at today's clinical visit, last visit was through telephone  in April 2020, she lives in [place] [facility], since COVID in March 2020, she was not able to see her family regularly, noted increased confusion, anxiety, she is taking Sinemet 25/100 mg 4 times a day, which is helpful, gabapentin 300 mg twice daily  because increased daytime sleepiness, she is doing better since decreased to 300 mg every night,    UPDATE June 24 2020: In December 2020, she went to the hospital was admitted for lethargy, abdominal pain, was found to be constipated, had 2 episodes of vomiting, improved after bowel movement.   Family reported increased confusion at last visit in March 2021, Sinemet dose was increased to 25/100 mg from 1-1 and half tablets 4 times a day, also add on Seroquel 25mg  qhs.   She was [facility], followed by rehab for 3 weeks, had severe dehydration, there was confuse of her medication during the process, seroquel was stopped, she was noted to have increased neck, she finally back to [place], now back to seroquel 25mg  qhs started on trazodone 50 mg 1/2 tablet every night since May 20, 2020,   She is again treated with macrobid since June 19 2020 for recurrent UTI, complains of generalized fatigue, blood pressure is on the low normal range 96/60  UPDATE Sept 22 2022: She is accompanied by her daughter at today's clinical visit, has slow worsening memory loss, difficult to carry on a conversation, was not able to provide any meaningful history, daughter reported that she sleeps okay most of the time, taking Seroquel 25 mg 2 tablets every night, continue taking Sinemet 25/100 mg 1 and half tablet 4 times a day, increased gait difficulty, she spends a lot of time in a lifting chair, but could not operate the chair, could not pick up her phone anymore because increased confusion, we were not able to complete MoCA examination today,   PHYSICAL EXAM:   Vitals:   08/19/21 0952  BP: 101/69  Pulse: 71   There is no height or weight on file to calculate BMI.  PHYSICAL EXAMNIATION:  Gen: NAD, conversant, well nourised, well groomed                     Cardiovascular: Regular rate rhythm, no peripheral edema, warm, nontender. Eyes: Conjunctivae clear without exudates  or hemorrhage Neck: Supple, no carotid bruits. Pulmonary: Clear to auscultation bilaterally   NEUROLOGICAL EXAM:  MENTAL STATUS: Speech/cognition: Tired looking elderly female, depressed, slow reaction time,   CRANIAL NERVES: CN II: Visual fields are full to confrontation. Pupils are round equal and briskly reactive to light. CN III, IV, VI: extraocular movement are normal. No ptosis. CN V: Facial sensation is intact to light touch CN VII: Face is symmetric with normal eye closure  CN VIII: Hearing is normal to causal conversation. CN IX, X: Phonation is normal. CN XI: Head turning and shoulder shrug are intact  MOTOR: Limited range of motion of bilateral shoulder, wrist upper and lower extremity against gravity, only mild fairly symmetric rigidity, bradykinesia  REFLEXES: Reflexes are 1 and symmetric at the biceps, triceps, knees, and ankles.    SENSORY: Intact to light touch, pinprick and vibratory sensation are intact in fingers and toes.  COORDINATION: There is no trunk or limb dysmetria noted.  GAIT/STANCE: Need 2 people assistance to get up from seated position, very cautious, tendency to lean backwards, difficulty initiate gait, unsteady,  REVIEW OF SYSTEMS:  Full 14 system review of systems  performed and notable only for as above All other review of systems were negative.   ALLERGIES: Allergies  Allergen Reactions   Benztropine     Other reaction(s): Hallucinations Stomach cramps Stomach cramps    Escitalopram     Hallucinations    HOME MEDICATIONS: Current Outpatient Medications  Medication Sig Dispense Refill   acetaminophen (TYLENOL) 500 MG tablet Take 500 mg by mouth every 6 (six) hours as needed.     atorvastatin (LIPITOR) 40 MG tablet Take 40 mg by mouth daily.     carbidopa-levodopa (SINEMET IR) 25-100 MG tablet Take 1.5 tablets by mouth 4 (four) times daily. 540 tablet 4   Cholecalciferol (VITAMIN D3) 2000 units TABS Take 2,000 Units by mouth  daily.     citalopram (CELEXA) 10 MG tablet Take 1 tablet (10 mg total) by mouth daily. 90 tablet 4   Cyanocobalamin (VITAMIN B-12 IJ) Inject 1,000 Units as directed every 30 (thirty) days.     diltiazem (CARDIZEM LA) 180 MG 24 hr tablet Take 180 mg by mouth daily.     docusate sodium (COLACE) 100 MG capsule Take 100 mg by mouth daily as needed for mild constipation.     furosemide (LASIX) 20 MG tablet Take 1 tablet by mouth every 24 hours as needed for edema. Take 20 mg for 3 days if 3 lb weight gain and take 20 mg for 7 days for 5 lb weight gain.     gabapentin (NEURONTIN) 300 MG capsule Take 1 capsule (300 mg total) by mouth at bedtime. 90 capsule 4   levothyroxine (SYNTHROID, LEVOTHROID) 88 MCG tablet Take 88 mcg by mouth daily before breakfast.      magnesium oxide (MAG-OX) 400 MG tablet Take 400 mg by mouth daily.     Melatonin 3 MG TABS Take 3 mg by mouth at bedtime.     omeprazole (PRILOSEC) 40 MG capsule Take 40 mg by mouth daily.     Polyethylene Glycol 3350-GRX POWD Take by mouth as needed.     POLYSACCHARIDE IRON COMPLEX PO 150-0.025-1mg  - one capsule twice daily     QUEtiapine (SEROQUEL) 25 MG tablet Take 0.5 tablets (12.5 mg total) by mouth at bedtime. (Patient taking differently: Take 50 mg by mouth at bedtime.) 30 tablet 3   rivaroxaban (XARELTO) 20 MG TABS tablet Take 20 mg by mouth daily with supper.     No current facility-administered medications for this visit.    PAST MEDICAL HISTORY: Past Medical History:  Diagnosis Date   Anemia    Anxiety    Atrial fibrillation (HCC)    Cataract    Diabetes mellitus without complication (HCC)    Hyperlipidemia    Hypertension    Parkinson disease (HCC)    Thyroid disorder     PAST SURGICAL HISTORY: Past Surgical History:  Procedure Laterality Date   APPENDECTOMY     CESAREAN SECTION     FEMUR FRACTURE SURGERY Right    KNEE SURGERY Bilateral    THYROIDECTOMY, PARTIAL     vein sugrery      FAMILY HISTORY: Family  History  Problem Relation Age of Onset   Breast cancer Mother    Heart disease Father     SOCIAL HISTORY: Social History   Socioeconomic History   Marital status: Widowed    Spouse name: Not on file   Number of children: 4   Years of education: 12   Highest education level: High school graduate  Occupational History   Occupation: Retired  Tobacco Use   Smoking status: Never   Smokeless tobacco: Never   Tobacco comments:    NEVER SMOKED  Vaping Use   Vaping Use: Never used  Substance and Sexual Activity   Alcohol use: Never    Alcohol/week: 0.0 standard drinks   Drug use: Never   Sexual activity: Never  Other Topics Concern   Not on file  Social History Narrative   Right-handed.   Lives at [facility]    Decaf coffee & drinks some diet pepsi   Social Determinants of Health   Financial Resource Strain: Not on file  Food Insecurity: Not on file  Transportation Needs: Not on file  Physical Activity: Not on file  Stress: Not on file  Social Connections: Not on file  Intimate Partner Violence: Not on file      Levert Feinstein, M.D. Ph.D.  [facility] [address], [address] [place], [place] [address] Ph: [phone] Fax: [phone]  CC:  [facility] [address] [place],  [place] [address]  [facility]

## 2021-09-29 ENCOUNTER — Encounter (HOSPITAL_BASED_OUTPATIENT_CLINIC_OR_DEPARTMENT_OTHER): Payer: Self-pay

## 2021-09-29 ENCOUNTER — Emergency Department (HOSPITAL_BASED_OUTPATIENT_CLINIC_OR_DEPARTMENT_OTHER): Payer: Medicare Other

## 2021-09-29 ENCOUNTER — Other Ambulatory Visit: Payer: Self-pay

## 2021-09-29 ENCOUNTER — Emergency Department (HOSPITAL_BASED_OUTPATIENT_CLINIC_OR_DEPARTMENT_OTHER)
Admission: EM | Admit: 2021-09-29 | Discharge: 2021-09-29 | Disposition: A | Discharge status: Home / Self Care | Payer: Medicare Other | Attending: Emergency Medicine | Admitting: Emergency Medicine

## 2021-09-29 DIAGNOSIS — S8001XA Contusion of right knee, initial encounter: Secondary | ICD-10-CM | POA: Diagnosis not present

## 2021-09-29 DIAGNOSIS — I10 Essential (primary) hypertension: Secondary | ICD-10-CM | POA: Insufficient documentation

## 2021-09-29 DIAGNOSIS — G20 Parkinson's disease: Secondary | ICD-10-CM | POA: Insufficient documentation

## 2021-09-29 DIAGNOSIS — Z7901 Long term (current) use of anticoagulants: Secondary | ICD-10-CM | POA: Diagnosis not present

## 2021-09-29 DIAGNOSIS — I4891 Unspecified atrial fibrillation: Secondary | ICD-10-CM | POA: Insufficient documentation

## 2021-09-29 DIAGNOSIS — E039 Hypothyroidism, unspecified: Secondary | ICD-10-CM | POA: Diagnosis not present

## 2021-09-29 DIAGNOSIS — Z79899 Other long term (current) drug therapy: Secondary | ICD-10-CM | POA: Diagnosis not present

## 2021-09-29 DIAGNOSIS — E1142 Type 2 diabetes mellitus with diabetic polyneuropathy: Secondary | ICD-10-CM | POA: Insufficient documentation

## 2021-09-29 DIAGNOSIS — W010XXA Fall on same level from slipping, tripping and stumbling without subsequent striking against object, initial encounter: Secondary | ICD-10-CM | POA: Diagnosis not present

## 2021-09-29 DIAGNOSIS — Y92129 Unspecified place in nursing home as the place of occurrence of the external cause: Secondary | ICD-10-CM | POA: Diagnosis not present

## 2021-09-29 DIAGNOSIS — F0390 Unspecified dementia without behavioral disturbance: Secondary | ICD-10-CM | POA: Diagnosis not present

## 2021-09-29 DIAGNOSIS — S8991XA Unspecified injury of right lower leg, initial encounter: Secondary | ICD-10-CM | POA: Diagnosis present

## 2021-09-29 NOTE — ED Triage Notes (Signed)
Per daughter she was advised by assisted living facility that pt was walking down the hall and her knees gave out-pt fell to both knees-denies head/neckinjury-pt was brought in by [facility] triage in w/c

## 2021-09-29 NOTE — ED Notes (Signed)
Pt arrives via [facility] from [facility] [address] where pt is resident. EMS reports patient had a witnessed fall with staff and family present where she fell forward on her knees, is on eliquis, did not hit head or have LOC. VS 124/70, HR 90, RR18, 95% RA, baseline mentation, CBG 256 with EMS.

## 2021-09-29 NOTE — Discharge Instructions (Signed)
Your bilateral knee x-rays were negative for acute fractures.  Your lower leg and upper leg x-ray was also negative for acute fracture.  You likely have a bone contusion.  Continue taking Tylenol for pain control.  If your pain worsens, you develop swelling, you are unable to walk please return to the emergency room.  Otherwise you can follow-up with your primary care and Ortho.

## 2021-09-29 NOTE — ED Provider Notes (Signed)
[facility] EMERGENCY DEPARTMENT Provider Note   CSN: [id number] Arrival date & time: 09/29/21  1357     History Chief Complaint  Patient presents with   Claudia Finley is a 80 y.o. female.  80 year old female presents today for evaluation of a fall while ambulating at her SNF.  Patient presents with her daughter.  Patient is a poor historian secondary to dementia.  Daughter was informed by the nursing staff that patient was ambulating and suddenly her knees gave out and she fell down to her knees.  Patient has been weightbearing since the fall but has not ambulated much.  She does use walker at baseline.  She is without fever, chills, abdominal pain, hip pain.  She did not hit her head during the fall or lose consciousness.   The history is provided by the patient. No language interpreter was used.      Past Medical History:  Diagnosis Date   Anemia    Anxiety    Atrial fibrillation (HCC)    Cataract    Diabetes mellitus without complication (HCC)    Hyperlipidemia    Hypertension    Parkinson disease (HCC)    Thyroid disorder     Patient Active Problem List   Diagnosis Date Noted   Dementia with behavioral disturbance 06/24/2020   Hallucinations 02/11/2020   Confusion 03/01/2019   Restless leg syndrome 12/17/2018   Gait abnormality 03/13/2018   Parkinsonism (HCC) 03/13/2018   Parkinson's disease (HCC) 06/22/2015   Colon polyp 04/07/2015   Decreased leukocytes 03/23/2015   HLD (hyperlipidemia) 03/20/2015   Anemia 01/12/2015   Change in blood platelet count 12/24/2014   Adult hypothyroidism 12/17/2014   Depression, major, 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     Family History  Problem Relation Age of Onset   Breast cancer Mother    Heart disease Father     Social History   Tobacco Use   Smoking status: Never   Smokeless tobacco: Never   Tobacco comments:    NEVER SMOKED  Vaping Use   Vaping Use: Never used  Substance Use Topics   Alcohol use: Never    Alcohol/week: 0.0 standard drinks   Drug use: Never    Home Medications Prior to Admission medications   Medication Sig Start Date End Date Taking? Authorizing Provider  acetaminophen (TYLENOL) 500 MG tablet Take 500 mg by mouth every 6 (six) hours as needed.    [provider]  atorvastatin (LIPITOR) 40 MG tablet Take 40 mg by mouth daily.    [provider]  carbidopa-levodopa (SINEMET IR) 25-100 MG tablet Take 1.5 tablets by mouth 4 (four) times daily. 08/21/19   Levert Feinstein, MD  Cholecalciferol (VITAMIN D3) 2000 units TABS Take 2,000 Units by mouth daily.    [provider]  citalopram (CELEXA) 10 MG tablet Take 1 tablet (10 mg total) by mouth  daily. 08/21/19   Levert Feinstein, MD  Cyanocobalamin (VITAMIN B-12 IJ) Inject 1,000 Units as directed every 30 (thirty) days.    [provider]  diltiazem (CARDIZEM LA) 180 MG 24 hr tablet Take 180 mg by mouth daily.    [provider]  docusate sodium (COLACE) 100 MG capsule Take 100 mg by mouth daily as needed for mild constipation.    [provider]  furosemide (LASIX) 20 MG tablet Take 1 tablet by mouth every 24 hours as needed for edema. Take 20 mg for 3 days if 3 lb weight gain and take 20 mg for 7 days for 5 lb weight gain.    [provider]  gabapentin (NEURONTIN) 300 MG capsule Take 1 capsule (300 mg total) by mouth at bedtime. 08/21/19   Levert Feinstein, MD   levothyroxine (SYNTHROID, LEVOTHROID) 88 MCG tablet Take 88 mcg by mouth daily before breakfast.     [provider]  magnesium oxide (MAG-OX) 400 MG tablet Take 400 mg by mouth daily.    [provider]  Melatonin 3 MG TABS Take 3 mg by mouth at bedtime.    [provider]  omeprazole (PRILOSEC) 40 MG capsule Take 40 mg by mouth daily.    [provider]  Polyethylene Glycol 3350-GRX POWD Take by mouth as needed.    [provider]  POLYSACCHARIDE IRON COMPLEX PO 150-0.025-1mg  - one capsule twice daily    [provider]  QUEtiapine (SEROQUEL) 25 MG tablet Take 0.5 tablets (12.5 mg total) by mouth at bedtime. Patient taking differently: Take 50 mg by mouth at bedtime. 02/12/20   Glean Salvo, NP  rivaroxaban (XARELTO) 20 MG TABS tablet Take 20 mg by mouth daily with supper.    [provider]    Allergies    Benztropine and Escitalopram  Review of Systems   Review of Systems  Unable to perform ROS: Dementia  Musculoskeletal:  Positive for arthralgias. Negative for gait problem and joint swelling.  Skin:  Negative for wound.   Physical Exam Updated Vital Signs BP 135/88 (BP Location: Left Arm)   Pulse 86   Temp 98.1 F (36.7 C) (Oral)   Resp 18   SpO2 96%   Physical Exam Vitals and nursing note reviewed.  Constitutional:      General: She is not in acute distress.    Appearance: Normal appearance. She is not ill-appearing.  HENT:     Head: Normocephalic and atraumatic.     Nose: Nose normal.  Eyes:     General: No scleral icterus.    Extraocular Movements: Extraocular movements intact.     Conjunctiva/sclera: Conjunctivae normal.  Cardiovascular:     Rate and Rhythm: Normal rate and regular rhythm.     Pulses: Normal pulses.     Heart sounds: Normal heart sounds.  Pulmonary:     Effort: Pulmonary effort is normal. No respiratory distress.     Breath sounds: Normal breath sounds. No wheezing or rales.   Abdominal:     General: There is no distension.     Tenderness: There is no abdominal tenderness.  Musculoskeletal:        General: Normal range of motion.     Cervical back: Normal range of motion.     Comments: Bilateral lower extremities without visual deformity or bruising.  Patient with mild tenderness to palpation over proximal fibula.  Patient without tenderness to palpation over right knee, bilateral hips, left knee, bilateral ankles.  Patient with full range of motion in bilateral ankles, bilateral knees, and bilateral hips.  Patient strength in bilateral extremities is intact and symmetrical.  Patient with symmetrical DP pulses.  Skin:    General: Skin is warm and dry.  Neurological:     General: No focal deficit present.     Mental Status: She is alert. Mental status is at baseline.    ED Results / Procedures / Treatments   Labs (all labs ordered are listed, but only abnormal results are displayed) Labs Reviewed - No data to display  EKG None  Radiology DG Tibia/Fibula Right  Result Date: 09/29/2021 CLINICAL DATA:  Fall, right knee pain EXAM: RIGHT TIBIA AND FIBULA - 2 VIEW COMPARISON:  None. FINDINGS: Distal right femoral ORIF and right total knee arthroplasty has been performed. Normal alignment. No acute fracture or dislocation. Vascular calcifications are noted. IMPRESSION: No acute fracture or dislocation. Electronically Signed   By: Helyn Numbers M.D.   On: 09/29/2021 21:30   DG Knee Complete 4 Views Left  Result Date: 09/29/2021 CLINICAL DATA:  Left knee pain.  Fell. EXAM: LEFT KNEE - COMPLETE 4+ VIEW COMPARISON:  None. FINDINGS: The total knee arthroplasty components are intact. No periprosthetic fracture. No joint effusion. IMPRESSION: No acute bony findings. Electronically Signed   By: Rudie Meyer M.D.   On: 09/29/2021 15:26   DG Knee Complete 4 Views Right  Result Date: 09/29/2021 CLINICAL DATA:  Larey Seat.  Right knee pain. EXAM: RIGHT KNEE - COMPLETE 4+ VIEW  COMPARISON:  None available for direct comparison. FINDINGS: There is a long lateral sideplate and multiple screws on the distal femur. The plate is fairly far away from the lateral cortex. 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 No periprosthetic fracture. No obvious joint effusion. IMPRESSION: 1. No acute bony findings or joint effusion. 2. Intact total knee arthroplasty components. Electronically Signed   By: Rudie Meyer M.D.   On: 09/29/2021 15:23   DG Femur Min 2 Views Right  Result Date: 09/29/2021 CLINICAL DATA:  Fall, right knee pain EXAM: RIGHT FEMUR 2 VIEWS COMPARISON:  None. FINDINGS: Right distal femoral ORIF utilizing a lateral plate plate and multiple screws as well as right total knee arthroplasty has been performed. No acute fracture or dislocation. Mild right hip degenerative arthritis. Vascular calcifications are seen within the medial soft tissues. IMPRESSION: No acute fracture or dislocation. Electronically Signed   By: Helyn Numbers M.D.   On: 09/29/2021 21:29    Procedures Procedures   Medications Ordered in ED Medications - No data to display  ED Course  I have reviewed the triage vital signs and the nursing notes.  Pertinent labs & imaging results that were available during my care of the patient were reviewed by me and considered in my medical decision making (see chart for details).    MDM Rules/Calculators/A&P                           80 year old female presents today for evaluation of fall with artificial knee joints bilaterally.  Patient's knee x-rays were without acute fracture or disruption and prosthesis.  Right knee does show spacing between the plate and right knee but without prior to compare to and without right knee joint pain, swelling it is unlikely that this is acute.  Right tib-fib and right femur also obtained given the tenderness to palpation of the proximal fibula.  Both are without acute fractures.  Patient is  appropriate for  discharge.  Patient likely has a contusion over the fibula.  Daughter would like to continue Tylenol for pain control at home.  Discussed importance of follow-up with PCP and orthopedics.  Daughter voiced understanding and is in agreement with plan.  Final Clinical Impression(s) / ED Diagnoses Final diagnoses:  Contusion of right knee, initial encounter    Rx / DC Orders ED Discharge Orders     None        Marita Kansas, PA-C 09/29/21 2255    Charlynne Pander, MD 09/30/21 [phone]

## 2021-10-07 ENCOUNTER — Ambulatory Visit: Payer: Medicare Other | Admitting: Neurology

## 2022-02-17 ENCOUNTER — Ambulatory Visit: Payer: Medicare Other | Admitting: Neurology

## 2022-02-17 ENCOUNTER — Encounter: Payer: Self-pay | Admitting: Neurology

## 2022-02-17 ENCOUNTER — Other Ambulatory Visit: Payer: Self-pay

## 2022-02-17 VITALS — BP 104/61 | HR 74

## 2022-02-17 DIAGNOSIS — G20 Parkinson's disease: Secondary | ICD-10-CM

## 2022-02-17 DIAGNOSIS — F03918 Unspecified dementia, unspecified severity, with other behavioral disturbance: Secondary | ICD-10-CM

## 2022-02-17 NOTE — Progress Notes (Signed)
? ? ?PATIENT: Claudia Finley ?DOB: 09/14/1941 ? ?REASON FOR VISIT: Follow up Parkinson's Disease ?HISTORY FROM: Patient, daughter Judeen Hammans  ?PRIMARY NEUROLOGIST: Dr.Yan  ? ?ASSESSMENT AND PLAN ?81 y.o. year old female  ? ?1.  Parkinson's disease ?2.  Worsening memory loss ?3.  Gait abnormality ? ?-Gradual decline over time ?-Continue Sinemet 25/100 mg 1.5 tablets 4 times daily ?-Encouraged to continue physical therapy, activity, still uses walker to walk to dining hall ?-Continue Seroquel, on 75 mg at facility, sleeping well, also Celexa 10 mg ?-Follow-up in 6 months or sooner if needed ? ?HISTORY  ?Claudia Finley is a 81 year old female, seen in refer by her primary care doctor Valora Piccolo, Quillian Quince B to follow-up for Parkinson's disease, she is accompanied by her daughter Theodoro Grist at today's visit, initial evaluation was on March 13, 2018. ?  ?She has past medical history of ?hypertension,  ?hyperlipidemia,  ?chronic atrial fibrillation, on chronic Xarelto treatment, denied previous history of coronary artery disease or stroke, somehow she is also on aspirin 81 mg daily, complains of frequent bruise, also recent worsening bilateral lower extremity edema.  ?  ?She was seen by Dr. Sabra Heck since 2015 because of gradual onset bilateral hands tremor, diffuse weakness, gait abnormality, decreased facial expression per record, micrographia, she was diagnosed with Parkinson's disease, initially was treated with Requip 0.25 mg gradually titrating up to current 2 mg 3 times a day,  ?  ?Sinemet was introduced around 2017, now taking 1 tablet 4 tabs daily She fell, suffered a right femur fracture in 2017, has relied on her walker since.   ?  ?She moved to assistant living [facility] since December 2018, with her husband, unfortunately, her husband passed away in 2017/12/10, she went through a lot of changes recently, ?  ?She now complains of diffuse body achy pain, increased right shoulder pain, had a history of right rotator  cuff syndrome, she also complains of diffuse joint pain, especially right knee, slow moving, she can still ambulate with her walker to the dining hall, along the hallway, dressing, but has worsening urinary incontinence, wearing depends, require help with bathing. She was also noted to have slow worsening memory loss,  ?  ?MRI was normal in February 2015,  ?Echocardiogram in March 2019 showed normal left ventricular site systolic function was no appreciable segmental abnormality, ejection fraction was 60%, moderate tricuspid regurgitation. ?  ?UPDATE Dec 17 2018: ?Requip was stopped in April 2019 for increased bilateral lower extremity swelling, she reported worsening restless leg symptoms, failed to improve restarting Requip at 4 mg at nighttime, throughout the day her legs were jumpy, worsened at nighttime, difficulty sleeping, also complains of increased anxiety, ?  ?She is with her daughter Coralyn Mark and Judeen Hammans at today's visit, sleeps well at nighttime, Mini-Mental status 28/30.  She has words finding difficulty, mild memory loss, continue complains of evening and nighttime restless leg symptoms, leg jumping, difficulty holding still ?  ?UPDATE Sept 23 2020: ?She is accompanied by her daughter Coralyn Mark at today's clinical visit, last visit was through telephone in April 2020, she lives in [facility], since [place] in March 2020, she was not able to see her family regularly, noted increased confusion, anxiety, she is taking Sinemet 25/100 mg 4 times a day, which is helpful, gabapentin 300 mg twice daily because increased daytime sleepiness, she is doing better since decreased to 300 mg every night,  ?  ?UPDATE June 24 2020: ?In December 2020, she went to the hospital  was admitted for lethargy, abdominal pain, was found to be constipated, had 2 episodes of vomiting, improved after bowel movement. ?  ?Family reported increased confusion at last visit in March 2021, Sinemet dose was increased to 25/100 mg from  1-1 and half tablets 4 times a day, also add on Seroquel 25mg  qhs. ?  ?She was [facility] for [facility], followed by rehab for 3 weeks, had severe dehydration, there was confuse of her medication during the process, seroquel was stopped, she was noted to have increased neck, she finally back to [facility], now back to seroquel 25mg  qhs started on trazodone 50 mg 1/2 tablet every night since May 20, 2020, ?  ?She is again treated with macrobid since June 19 2020 for recurrent UTI, complains of generalized fatigue, blood pressure is on the low normal range 96/60 ?  ?UPDATE Sept 22 2022: ?She is accompanied by her daughter at today's clinical visit, has slow worsening memory loss, difficult to carry on a conversation, was not able to provide any meaningful history, daughter reported that she sleeps okay most of the time, taking Seroquel 25 mg 2 tablets every night, continue taking Sinemet 25/100 mg 1 and half tablet 4 times a day, increased gait difficulty, she spends a lot of time in a lifting chair, but could not operate the chair, could not pick up her phone anymore because increased confusion, we were not able to complete MoCA examination today, ? ?Update February 17, 2022 SS: Here today with Judeen Hammans, daughter, she is living at [facility]. Doing some PT, some walking to dining hall, fell out of wheelchair bruise to her arm on her [birth year], uses walker. Sits in lift chair, tries to crawl out of chair if can't work remote. Speech can be non-sense, words won't come out. Gets anxious at night, often at nurse station when daughter comes to visit. On Seroquel 75 mg at bedtime, Celexa 10 mg daily. Looks good today, got her hair done today.  ?  ?REVIEW OF SYSTEMS: Out of a complete 14 system review of symptoms, the patient complains only of the following symptoms, and all other reviewed systems are negative. ? ?See HPI  ? ?ALLERGIES: ?Allergies  ?Allergen Reactions  ? Benztropine   ?  Other  reaction(s): Hallucinations ?Stomach cramps ?Stomach cramps ?  ? Escitalopram   ?  Hallucinations  ? ? ?HOME MEDICATIONS: ?Outpatient Medications Prior to Visit  ?Medication Sig Dispense Refill  ? apixaban (ELIQUIS) 5 MG TABS tablet Take two tablets twice a day for 3 days and then starting on 07/20 take one tablet twice a day.    ? carbidopa-levodopa (SINEMET IR) 25-100 MG tablet Take 1.5 tablets by mouth 4 (four) times daily. 540 tablet 4  ? diltiazem (CARDIZEM LA) 180 MG 24 hr tablet Take 180 mg by mouth daily.    ? docusate sodium (COLACE) 100 MG capsule Take 100 mg by mouth daily as needed for mild constipation.    ? gabapentin (NEURONTIN) 300 MG capsule Take 1 capsule (300 mg total) by mouth at bedtime. 90 capsule 4  ? levothyroxine (SYNTHROID, LEVOTHROID) 88 MCG tablet Take 88 mcg by mouth daily before breakfast.     ? losartan (COZAAR) 50 MG tablet Take by mouth.    ? magnesium oxide (MAG-OX) 400 MG tablet Take 400 mg by mouth daily.    ? Melatonin 3 MG TABS Take 3 mg by mouth at bedtime.    ? mirtazapine (REMERON) 7.5 MG tablet  Take 7.5 mg by mouth at bedtime.    ? Polyethylene Glycol 3350-GRX POWD Take by mouth as needed.    ? POLYSACCHARIDE IRON COMPLEX PO 150-0.025-1mg  - one capsule twice daily    ? QUEtiapine (SEROQUEL) 25 MG tablet Take 0.5 tablets (12.5 mg total) by mouth at bedtime. (Patient taking differently: Take 50 mg by mouth at bedtime.) 30 tablet 3  ? acetaminophen (TYLENOL) 500 MG tablet Take 500 mg by mouth every 6 (six) hours as needed.    ? atorvastatin (LIPITOR) 40 MG tablet Take 40 mg by mouth daily.    ? Cholecalciferol (VITAMIN D3) 2000 units TABS Take 2,000 Units by mouth daily.    ? citalopram (CELEXA) 10 MG tablet Take 1 tablet (10 mg total) by mouth daily. 90 tablet 4  ? Cyanocobalamin (VITAMIN B-12 IJ) Inject 1,000 Units as directed every 30 (thirty) days.    ? furosemide (LASIX) 20 MG tablet Take 1 tablet by mouth every 24 hours as needed for edema. Take 20 mg for 3 days if 3 lb  weight gain and take 20 mg for 7 days for 5 lb weight gain.    ? omeprazole (PRILOSEC) 40 MG capsule Take 40 mg by mouth daily.    ? rivaroxaban (XARELTO) 20 MG TABS tablet Take 20 mg by mouth daily with supper.

## 2022-02-17 NOTE — Patient Instructions (Signed)
Continue current medications ?See you back in 6 months ?Continue physical therapy  ?

## 2022-08-24 ENCOUNTER — Ambulatory Visit: Payer: Medicare Other | Admitting: Neurology

## 2022-08-24 VITALS — BP 128/64 | HR 80 | Ht 67.0 in

## 2022-08-24 DIAGNOSIS — F03918 Unspecified dementia, unspecified severity, with other behavioral disturbance: Secondary | ICD-10-CM

## 2022-08-24 DIAGNOSIS — R269 Unspecified abnormalities of gait and mobility: Secondary | ICD-10-CM | POA: Diagnosis not present

## 2022-08-24 DIAGNOSIS — G20 Parkinson's disease: Secondary | ICD-10-CM

## 2022-08-24 NOTE — Progress Notes (Signed)
PATIENT: Claudia Finley DOB: 08/18/1941  REASON FOR VISIT: Follow up Parkinson's Disease HISTORY FROM: Patient, daughters PRIMARY NEUROLOGIST: Dr.Yan   ASSESSMENT AND PLAN 81 y.o. year old female   1.  Parkinson's disease 2.  Worsening memory loss 3.  Gait abnormality  -Continues to decline with memory and mobility -We we will continue Sinemet 25/100 mg 1.5 tablets 4 times daily, I don't think there would be any benefit in higher dosing -Continues to struggle with mood, her current regimen works fairly well, she sleeps well at night: Celexa 10 mg daily, Remeron 7.5 mg at bedtime, Seroquel 75 mg at bedtime -Would recommend psychiatric NP rounds routinely on Claudia Finley -We discussed continued follow-up at our office, daughters would like to continue to come, they will return in 6 months with Dr. Terrace Arabia   HISTORY  Claudia Finley is a 81 year old female, seen in refer by her primary care doctor Claudia Finley to follow-up for Parkinson's disease, she is accompanied by her daughter Claudia Finley at today's visit, initial evaluation was on March 13, 2018.   She has past medical history of hypertension,  hyperlipidemia,  chronic atrial fibrillation, on chronic Xarelto treatment, denied previous history of coronary artery disease or stroke, somehow she is also on aspirin 81 mg daily, complains of frequent bruise, also recent worsening bilateral lower extremity edema.    She was seen by Dr. [name] since 2015 because of gradual onset bilateral hands tremor, diffuse weakness, gait abnormality, decreased facial expression per record, micrographia, she was diagnosed with Parkinson's disease, initially was treated with Requip 0.25 mg gradually titrating up to current 2 mg 3 times a day,    Sinemet was introduced around 2017, now taking 1 tablet 4 tabs daily She fell, suffered a right femur fracture in 2017, has relied on her walker since.     She moved to assistant living [facility] since December  2018, with her husband, unfortunately, her husband passed away in 04-Feb-2019she went through a lot of changes recently,   She now complains of diffuse body achy pain, increased right shoulder pain, had a history of right rotator cuff syndrome, she also complains of diffuse joint pain, especially right knee, slow moving, she can still ambulate with her walker to the dining hall, along the hallway, dressing, but has worsening urinary incontinence, wearing depends, require help with bathing. She was also noted to have slow worsening memory loss,    MRI was normal in February 2015,  Echocardiogram in March 2019 showed normal left ventricular site systolic function was no appreciable segmental abnormality, ejection fraction was 60%, moderate tricuspid regurgitation.   UPDATE Dec 17 2018: Requip was stopped in April 2019 for increased bilateral lower extremity swelling, she reported worsening restless leg symptoms, failed to improve restarting Requip at 4 mg at nighttime, throughout the day her legs were jumpy, worsened at nighttime, difficulty sleeping, also complains of increased anxiety,   She is with her daughter Claudia Finley and Claudia Finley at today's visit, sleeps well at nighttime, Mini-Mental status 28/30.  She has words finding difficulty, mild memory loss, continue complains of evening and nighttime restless leg symptoms, leg jumping, difficulty holding still   UPDATE Sept 23 2020: She is accompanied by her daughter Claudia Finley at today's clinical visit, last visit was through telephone in April 2020, she lives in [facility], since [place] in March 2020, she was not able to see her family regularly, noted increased confusion, anxiety, she is taking Sinemet 25/100 mg 4  times a day, which is helpful, gabapentin 300 mg twice daily because increased daytime sleepiness, she is doing better since decreased to 300 mg every night,    UPDATE June 24 2020: In December 2020, she went to the hospital was  admitted for lethargy, abdominal pain, was found to be constipated, had 2 episodes of vomiting, improved after bowel movement.   Family reported increased confusion at last visit in March 2021, Sinemet dose was increased to 25/100 mg from 1-1 and half tablets 4 times a day, also add on Seroquel 25mg  qhs.   She was [facility], followed by rehab for 3 weeks, had severe dehydration, there was confuse of her medication during the process, seroquel was stopped, she was noted to have increased neck, she finally back to [facility], now back to seroquel 25mg  qhs started on trazodone 50 mg 1/2 tablet every night since May 20, 2020,   She is again treated with macrobid since June 19 2020 for recurrent UTI, complains of generalized fatigue, blood pressure is on the low normal range 96/60   UPDATE Sept 22 2022: She is accompanied by her daughter at today's clinical visit, has slow worsening memory loss, difficult to carry on a conversation, was not able to provide any meaningful history, daughter reported that she sleeps okay most of the time, taking Seroquel 25 mg 2 tablets every night, continue taking Sinemet 25/100 mg 1 and half tablet 4 times a day, increased gait difficulty, she spends a lot of time in a lifting chair, but could not operate the chair, could not pick up her phone anymore because increased confusion, we were not able to complete MoCA examination today,  Update February 17, 2022 SS: Here today with 07-30-2005, daughter, she is living at [facility]. Doing some PT, some walking to dining hall, fell out of wheelchair bruise to her arm on her [birth year], uses walker. Sits in lift chair, tries to crawl out of chair if can't work remote. Speech can be non-sense, words won't come out. Gets anxious at night, often at nurse station when daughter comes to visit. On Seroquel 75 mg at bedtime, Celexa 10 mg daily. Looks good today, got her hair done today.   Update August 24, 2022 SS: Here today with daughters. Still at [facility], seeing a decline in her memory. Speech can be jibberish, harder to interpret. Still anxiety in the evening, gets fidgety. She does sleep at night, daughter goes every night at 8 PM, gets up the next morning at 11 AM. Uses weighted utensils, needs some help with eating. Food is chopped, no trouble swallowing. Therapy has stopped for now, has to wait for insurance, it maintains her mobility.  She walks from chair to bathroom with walker. Doesn't remember to ask for help with going to the bathroom. Has facility doctor from [facility] making house calls, also has behavorial NP.    REVIEW OF SYSTEMS: Out of a complete 14 system review of symptoms, the patient complains only of the following symptoms, and all other reviewed systems are negative.  See HPI   ALLERGIES: Allergies  Allergen Reactions   Benztropine     Other reaction(s): Hallucinations Stomach cramps Stomach cramps    Escitalopram     Hallucinations    HOME MEDICATIONS: Outpatient Medications Prior to Visit  Medication Sig Dispense Refill   apixaban (ELIQUIS) 5 MG TABS tablet Take two tablets twice a day for 3 days and then starting on 07/20 take  one tablet twice a day.     carbidopa-levodopa (SINEMET IR) 25-100 MG tablet Take 1.5 tablets by mouth 4 (four) times daily. 540 tablet 4   diltiazem (CARDIZEM LA) 180 MG 24 hr tablet Take 180 mg by mouth daily.     gabapentin (NEURONTIN) 300 MG capsule Take 1 capsule (300 mg total) by mouth at bedtime. 90 capsule 4   mirtazapine (REMERON) 7.5 MG tablet Take 7.5 mg by mouth at bedtime.     Polyethylene Glycol 3350-GRX POWD Take by mouth as needed.     QUEtiapine (SEROQUEL) 25 MG tablet Take 0.5 tablets (12.5 mg total) by mouth at bedtime. (Patient taking differently: Take 50 mg by mouth at bedtime.) 30 tablet 3   docusate sodium (COLACE) 100 MG capsule Take 100 mg by mouth daily as needed for mild constipation. (Patient not taking:  Reported on 08/24/2022)     levothyroxine (SYNTHROID, LEVOTHROID) 88 MCG tablet Take 88 mcg by mouth daily before breakfast.  (Patient not taking: Reported on 08/24/2022)     losartan (COZAAR) 50 MG tablet Take by mouth. (Patient not taking: Reported on 08/24/2022)     POLYSACCHARIDE IRON COMPLEX PO 150-0.025-1mg  - one capsule twice daily (Patient not taking: Reported on 08/24/2022)     magnesium oxide (MAG-OX) 400 MG tablet Take 400 mg by mouth daily.     Melatonin 3 MG TABS Take 3 mg by mouth at bedtime.     No facility-administered medications prior to visit.    PAST MEDICAL HISTORY: Past Medical History:  Diagnosis Date   Anemia    Anxiety    Atrial fibrillation (HCC)    Cataract    Diabetes mellitus without complication (HCC)    Hyperlipidemia    Hypertension    Parkinson disease ([place])    Thyroid disorder     PAST SURGICAL HISTORY: Past Surgical History:  Procedure Laterality Date   APPENDECTOMY     CESAREAN SECTION     FEMUR FRACTURE SURGERY Right    KNEE SURGERY Bilateral    THYROIDECTOMY, PARTIAL     vein sugrery      FAMILY HISTORY: Family History  Problem Relation Age of Onset   Breast cancer Mother    Heart disease Father     SOCIAL HISTORY: Social History   Socioeconomic History   Marital status: Widowed    Spouse name: Not on file   Number of children: 4   Years of education: 12   Highest education level: High school graduate  Occupational History   Occupation: Retired  Tobacco Use   Smoking status: Never   Smokeless tobacco: Never   Tobacco comments:    NEVER SMOKED  Vaping Use   Vaping Use: Never used  Substance and Sexual Activity   Alcohol use: Never    Alcohol/week: 0.0 standard drinks of alcohol   Drug use: Never   Sexual activity: Never  Other Topics Concern   Not on file  Social History Narrative   Right-handed.   Lives at [facility]    Decaf coffee & drinks some diet pepsi   Social  Determinants of Health   Financial Resource Strain: Not on file  Food Insecurity: Not on file  Transportation Needs: Not on file  Physical Activity: Not on file  Stress: Not on file  Social Connections: Not on file  Intimate Partner Violence: Not on file    PHYSICAL EXAM  Vitals:   08/24/22 1512  BP: 128/64  Pulse: 80  Height: 5\' 7"  (1.702 m)    Body mass index is 25.83 kg/m.  Generalized: Well developed, in no acute distress, masking of face, quiet, flat  Neurological examination  Mentation: Alert, cooperative, most history is provided by her daughter, speech is slow at times doesn't make sense, slow processing time, she is engaged in the  visit Cranial nerve II-XII: Pupils were equal round reactive to light. Extraocular movements were full, visual field were full on confrontational test. Facial sensation and strength were normal.  Head turning and shoulder shrug  were normal and symmetric. Motor: Strength is overall intact but deconditioned, not much effort with strength exam, there is overall bradykinesia.  No significant tremor was seen. Sensory: Sensory testing is intact to soft touch on all 4 extremities. No evidence of extinction is noted.  Coordination: Ataxia with these commands Gait and station: We tried to stand with myself and her daughter but she couldn't get her weight underneath her and leaned backwards, she sat back down  DIAGNOSTIC DATA (LABS, IMAGING, TESTING) - I reviewed patient records, labs, notes, testing and imaging myself where available.  Lab Results  Component Value Date   WBC 4.6 10/31/2018   HGB 12.3 10/31/2018   HCT 38.5 10/31/2018   MCV 98.2 10/31/2018   PLT 118 (L) 10/31/2018      Component Value Date/Time   NA 137 10/31/2018 1425   K 4.2 10/31/2018 1425   CL 101 10/31/2018 1425   CO2 27 10/31/2018 1425   GLUCOSE 124 (H) 10/31/2018 1425   BUN 12 10/31/2018 1425   CREATININE 0.83 10/31/2018 1425   CALCIUM 9.3 10/31/2018 1425    GFRNONAA >60 10/31/2018 1425   GFRAA >60 10/31/2018 1425   No results found for: "CHOL", "HDL", "LDLCALC", "LDLDIRECT", "TRIG", "CHOLHDL" No results found for: "HGBA1C" No results found for: "VITAMINB12" No results found for: "TSH"  14/02/2018, DNP  [facility] [address], [address] [place], [place] [phone]

## 2022-11-02 ENCOUNTER — Encounter: Payer: Self-pay | Admitting: Neurology

## 2022-11-02 ENCOUNTER — Telehealth: Payer: Self-pay | Admitting: Neurology

## 2022-11-02 NOTE — Telephone Encounter (Signed)
Called pt, VM box full. Sent letter to inform pt of appointment change from 02/27/23 to 02/20/23 due to MD being out

## 2022-11-28 DEATH — deceased

## 2023-02-20 ENCOUNTER — Ambulatory Visit: Payer: Medicare Other | Admitting: Neurology

## 2023-02-27 ENCOUNTER — Ambulatory Visit: Payer: Medicare Other | Admitting: Neurology

## 2023-04-28 IMAGING — DX DG KNEE COMPLETE 4+V*R*
1 series · 1 of 1 positions shown · non-contrast
Comparison: None available for direct comparison.

CLINICAL DATA: Fell.  Right knee pain.

EXAM:
RIGHT KNEE - COMPLETE 4+ VIEW

[knee lat]
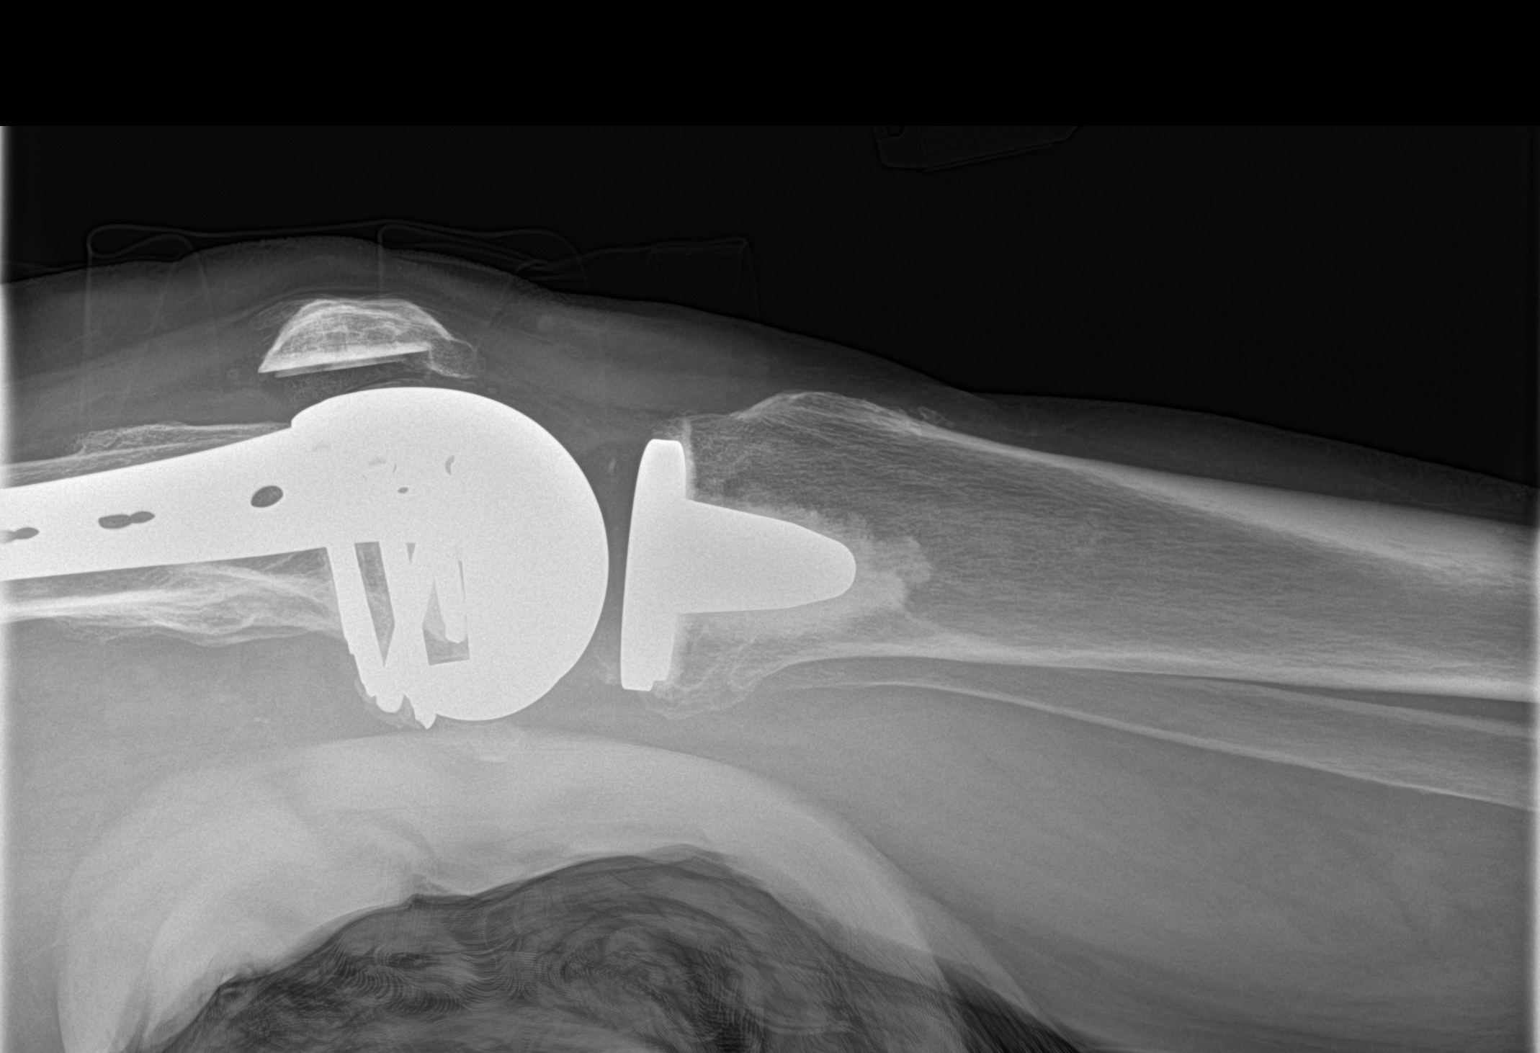

[1 of 1 positions shown; findings below may reference images not displayed]

FINDINGS: There is a long lateral sideplate and multiple screws on the distal
femur. The plate is fairly far away from the lateral cortex.

The total knee arthroplasty components are intact. No periprosthetic
fracture. No obvious joint effusion.
IMPRESSION: 1. No acute bony findings or joint effusion.
2. Intact total knee arthroplasty components.
